# Patient Record
Sex: Male | Born: 1965
Health system: Southern US, Community
[De-identification: ages and names within clinical notes are randomized; demographics above are authoritative.]

## PROBLEM LIST (undated history)

## (undated) DIAGNOSIS — G709 Myoneural disorder, unspecified: Secondary | ICD-10-CM

## (undated) DIAGNOSIS — M1990 Unspecified osteoarthritis, unspecified site: Secondary | ICD-10-CM

## (undated) HISTORY — DX: Myoneural disorder, unspecified: G70.9

## (undated) HISTORY — PX: WISDOM TOOTH EXTRACTION: SHX21

## (undated) HISTORY — DX: Unspecified osteoarthritis, unspecified site: M19.90

## (undated) HISTORY — PX: COLONOSCOPY: SHX5424

---

## 2000-05-19 ENCOUNTER — Emergency Department (HOSPITAL_COMMUNITY): Admission: EM | Admit: 2000-05-19 | Discharge: 2000-05-19 | Payer: Self-pay

## 2000-05-19 ENCOUNTER — Encounter: Payer: Self-pay | Admitting: Emergency Medicine

## 2003-11-09 ENCOUNTER — Encounter: Admission: RE | Admit: 2003-11-09 | Discharge: 2003-11-09 | Payer: Self-pay | Admitting: Family Medicine

## 2017-08-01 ENCOUNTER — Other Ambulatory Visit: Payer: Self-pay

## 2017-08-17 ENCOUNTER — Ambulatory Visit: Payer: BLUE CROSS/BLUE SHIELD | Admitting: Sports Medicine

## 2017-08-17 ENCOUNTER — Encounter: Payer: Self-pay | Admitting: Sports Medicine

## 2017-08-17 ENCOUNTER — Ambulatory Visit: Payer: Self-pay

## 2017-08-17 VITALS — BP 112/80 | HR 52 | Ht 72.0 in | Wt 192.0 lb

## 2017-08-17 DIAGNOSIS — M25511 Pain in right shoulder: Secondary | ICD-10-CM

## 2017-08-17 NOTE — Patient Instructions (Signed)

## 2017-08-17 NOTE — Assessment & Plan Note (Signed)
Long-standing shoulder pain with multifactorial etiology but concern for potential SLAP tear in the setting of underlying supraspinatus and subscap tendinopathy with possible articular sided tearing of the supraspinatus that is partial-thickness.  There does appear to be some swelling around the posterior labrum and we went ahead and performed an intra-articular injection today.  Hold nitroglycerin times 2 weeks and at that time okay to resume activities as tolerated.  Continue with therapeutic exercises after 48-72 hours and slowly increase them as tolerated but being cautious for the first 2 weeks.  Avoid overhead lifting and pushing activities.  If any lack of improvement MR arthrogram recommended.

## 2017-08-17 NOTE — Procedures (Signed)
PROCEDURE NOTE:  Ultrasound Guided: Injection: right Shoulder Intra-articular Images were obtained and interpreted by myself, Gaspar BiddingMichael Maisa Bedingfield, DO  Images have been saved and stored to PACS system. Images obtained on: GE S7 Ultrasound machine  ULTRASOUND FINDINGS:  Biceps Tendon: Normal Pec Major Insertion: Normal Subscapularis Tendon: Slight interstitial tearing with diminutive size given the overlying musculature.  Possible small articular sided tear Supraspinatus Tendon: Chronic tendinopathy with interstitial splitting at minimum and possible articular sided partial-thickness tear that is oblique in nature. Infraspinatus/Teres Minor Tendon: Normal AC Joint: Moderate arthrosis with positive mushroom sign, no pain with sono palpation JOINT: Mild glenohumeral spurring but this is minimal. LABRUM: Appears to have a likely intrasubstance tear   DESCRIPTION OF PROCEDURE:  The patient's clinical condition is marked by substantial pain and/or significant functional disability. Other conservative therapy has not provided relief, is contraindicated, or not appropriate. There is a reasonable likelihood that injection will significantly improve the patient's pain and/or functional impairment.  After discussing the risks, benefits and expected outcomes of the injection and all questions were reviewed and answered, the patient wished to undergo the above named procedure. Verbal consent was obtained.  The ultrasound was used to identify the target structure and adjacent neurovascular structures. The skin was then prepped in sterile fashion and the target structure was injected under direct visualization using sterile technique as below:  PREP: Alcohol, Ethel Chloride  APPROACH: posterior, stopcock technique, 22g 3.5in.  INJECTATE: 5cc: 1% lidocaine, 2cc: 0.5% marcaine, 2cc: 40mg /mL DepoMedrol   ASPIRATE: None   DRESSING: Band-Aid  Post procedural instructions including recommending icing and warning  signs for infection were reviewed.   This procedure was well tolerated and there were no complications.   IMPRESSION: Succesful Ultrasound Guided: Injection

## 2017-08-17 NOTE — Progress Notes (Signed)
Veverly FellsMichael D. Delorise Shinerigby, DO  [facility] [phone]  Jeffrey Nicholson - 52 y.o. male MRN [id number]  Date of birth: 1965/08/11  Visit Date: 08/17/2017  PCP: Ozella RocksMerrell, David J, MD   Referred by: Ozella RocksMerrell, David J, MD   Scribe for today's visit: Stevenson ClinchBrandy Coleman, CMA     SUBJECTIVE:  Jeffrey Nicholson is here for New Patient (Initial Visit) (R shoulder pain) .  Referred by: Dr. Shelly Flattenavid Merrell His R shoulder pain symptoms INITIALLY: Began about 2 years ago and MOI is unknown. He was doing cross training a couple of years ago around the time the pain started.  Described as mild aching, nonradiating Worsened with rotation, reaching down and back. Pain is also worse when sleeping on R side.  Improved with rest. He has gotten some relief when using a tennis ball or foam roller.  Additional associated symptoms include: Pain is lateral, feels like its under the deltoid. He does have some pain in his neck, feels like neck needs to be cracked. He denies popping or clicking in the shoulder.     At this time symptoms show no change compared to onset  He has tried taking Meloxicam and following Nitro Protocol with minimal relief. He has tried icing the shoulder with minimal relief. He has tried Tylenol with minimal relief. He has tried going to PT and home exercises with minimal relief.   No recent xray of the shoulder or neck.    ROS Reports night time disturbances. Denies fevers, chills, or night sweats. Denies unexplained weight loss. Denies personal history of cancer. Denies changes in bowel or bladder habits. Denies recent unreported falls. Denies new or worsening dyspnea or wheezing. Denies headaches or dizziness.  Denies numbness, tingling or weakness  In the extremities.  Denies dizziness or presyncopal episodes Denies lower extremity edema    HISTORY & PERTINENT PRIOR DATA:  Prior History reviewed and updated per electronic medical  record.  Significant history, findings, studies and interim changes include:  reports that  has never smoked. he has never used smokeless tobacco. No results for input(s): HGBA1C, LABURIC, CREATINE in the last 8760 hours. No specialty comments available. Problem  Right Shoulder Pain    OBJECTIVE:  VS:  HT:6' (182.9 cm)   WT:192 lb (87.1 kg)  BMI:26.03    BP:112/80  HR:(!) 52bpm  TEMP: ( )  RESP:97 %   PHYSICAL EXAM: Constitutional: WDWN, Non-toxic appearing. Psychiatric: Alert & appropriately interactive.  Not depressed or anxious appearing. Respiratory: No increased work of breathing.  Trachea Midline Eyes: Pupils are equal.  EOM intact without nystagmus.  No scleral icterus  NEUROVASCULAR exam: No clubbing or cyanosis appreciated No significant venous stasis changes Capillary Refill: normal, less than 2 seconds   Right Shoulder Exam: Normal alignment & Contours, bossing of the AC joint Skin: No overlying erythema/ecchymosis Neck: normal range of motion and supple Axial loading and circumduction produces: Small amount of pain  Non tender to palpation over: Bony Landmarks TTP over: none Drop arm test: negative Hawkins: normal, no pain  Neers: normal, no pain   Internal Rotation:  ROM: Normal with mild pain Strength: 5/5 External Rotation:  ROM: Normal with no pain Strength: 5/5  Empty can: positive, mild pain Strength: 5/5 Speeds: positive, mild pain Strength: 5/5 O'Briens: positive, moderate pain Strength: 5/5  ASSESSMENT & PLAN:   1. Right shoulder pain, unspecified chronicity    ++++++++++++++++++++++++++++++++++++++++++++ Orders & Meds:  Orders Placed This Encounter  Procedures  .  [name] MSK POCT ULTRASOUND   No orders of the defined types were placed in this encounter.   ++++++++++++++++++++++++++++++++++++++++++++ PLAN:   Findings:  See below.  Additionally does have he does have AC joint arthropathy at the very least and likely a type II acromion at  minimum and I suspect he is getting some subacromial impingement from the arthritic change.  If lack of improvement plain film x-rays and MR arthrogram will be recommended for potential surgical intervention for subacromial decompression and possible labral repair if indicated.   Right shoulder pain Long-standing shoulder pain with multifactorial etiology but concern for potential SLAP tear in the setting of underlying supraspinatus and subscap tendinopathy with possible articular sided tearing of the supraspinatus that is partial-thickness.  There does appear to be some swelling around the posterior labrum and we went ahead and performed an intra-articular injection today.  Hold nitroglycerin times 2 weeks and at that time okay to resume activities as tolerated.  Continue with therapeutic exercises after 48-72 hours and slowly increase them as tolerated but being cautious for the first 2 weeks.  Avoid overhead lifting and pushing activities.  If any lack of improvement MR arthrogram recommended.   Follow-up: Return in about 6 weeks (around 09/28/2017).   Pertinent documentation may be included in additional procedure notes, imaging studies, problem based documentation and patient instructions. Please see these sections of the encounter for additional information regarding this visit. CMA/ATC served as Neurosurgeon during this visit. History, Physical, and Plan performed by medical provider. Documentation and orders reviewed and attested to.      Andrena Mews, DO    [facility] Physician

## 2017-09-28 ENCOUNTER — Ambulatory Visit: Payer: BLUE CROSS/BLUE SHIELD | Admitting: Sports Medicine

## 2017-09-28 ENCOUNTER — Encounter: Payer: Self-pay | Admitting: Sports Medicine

## 2017-09-28 VITALS — BP 130/86 | HR 65 | Ht 72.0 in | Wt 192.2 lb

## 2017-09-28 DIAGNOSIS — M25511 Pain in right shoulder: Secondary | ICD-10-CM | POA: Diagnosis not present

## 2017-09-28 NOTE — Progress Notes (Signed)
Veverly Fells. [facility] [phone]  Jeffrey Nicholson - 52 y.o. male MRN [id number]  Date of birth: 1966/04/30  Visit Date: 09/28/2017  PCP: Ozella Rocks, MD   Referred by: Ozella Rocks, MD  Scribe for today's visit: Christoper Fabian, LAT, ATC     SUBJECTIVE:  Jeffrey Nicholson is here for Follow-up (R shoulder pain) .   Notes from Initial Visit on 08/17/17 His R shoulder pain symptoms INITIALLY: Began about 2 years ago and MOI is unknown. He was doing cross training a couple of years ago around the time the pain started.  Described as mild aching, nonradiating Worsened with rotation, reaching down and back. Pain is also worse when sleeping on R side.  Improved with rest. He has gotten some relief when using a tennis ball or foam roller.  Additional associated symptoms include: Pain is lateral, feels like its under the deltoid. He does have some pain in his neck, feels like neck needs to be cracked. He denies popping or clicking in the shoulder.     At this time symptoms show no change compared to onset  He has tried taking Meloxicam and following Nitro Protocol with minimal relief. He has tried icing the shoulder with minimal relief. He has tried Tylenol with minimal relief. He has tried going to PT and home exercises with minimal relief.   No recent xray of the shoulder or neck.   09/28/17: Compared to the last office visit on 08/17/17, his previously described R shoulder pain symptoms are improving noting about 60-70% improvement.  He states that he has not been working out at the gym over the past 6 weeks so he's curious to see how his shoulder feels when he resumes this. Current symptoms are mild & are nonradiating He has used both nitroglycerin patches and meloxicam previously.  Had a R shoulder injection at his last visit.  ROS Denies night time disturbances. Denies fevers, chills, or night  sweats. Denies unexplained weight loss. Denies personal history of cancer. Denies changes in bowel or bladder habits. Denies recent unreported falls. Denies new or worsening dyspnea or wheezing. Denies headaches or dizziness.  Denies numbness, tingling or weakness  In the extremities.  Denies dizziness or presyncopal episodes Denies lower extremity edema    HISTORY & PERTINENT PRIOR DATA:  Prior History reviewed and updated per electronic medical record.  Significant/pertinent history, findings, studies include:  reports that he has never smoked. He has never used smokeless tobacco. No results for input(s): HGBA1C, LABURIC, CREATINE in the last 8760 hours. No specialty comments available. No problems updated.  OBJECTIVE:  VS:  HT:6' (182.9 cm)   WT:192 lb 3.2 oz (87.2 kg)  BMI:26.06    BP:130/86  HR:65bpm  TEMP: ( )  RESP:96 %   PHYSICAL EXAM: Constitutional: WDWN, Non-toxic appearing. Psychiatric: Alert & appropriately interactive.  Not depressed or anxious appearing. Respiratory: No increased work of breathing.  Trachea Midline Eyes: Pupils are equal.  EOM intact without nystagmus.  No scleral icterus  Vascular Exam: warm to touch no edema  upper extremity neuro exam: unremarkable  MSK Exam: Right shoulder is overall well aligned.  Still has a slight scapular protraction with overhead reaching and with slight scapular dyskinesis but markedly improved.  His intrinsic rotator cuff strength is 5 out of 5.  Mildly positive Hawkins.   ASSESSMENT & PLAN:   1. Right shoulder pain, unspecified chronicity     PLAN:  Overall is doing better.  I would like for him to slowly return to activities as tolerated try to limit the overhead activities at the gym.  We did discuss that overloading his AC joint as well as the glenohumeral joint will tend to exacerbate his symptoms and also emphasized the importance of appropriate scapular setting/stabilization.  He will follow-up as  needed if any return of his symptoms but overall is happy with his progress.  Follow-up: No follow-ups on file.      Please see additional documentation for Objective, Assessment and Plan sections. Pertinent additional documentation may be included in corresponding procedure notes, imaging studies, problem based documentation and patient instructions. Please see these sections of the encounter for additional information regarding this visit.  CMA/ATC served as Neurosurgeonscribe during this visit. History, Physical, and Plan performed by medical provider. Documentation and orders reviewed and attested to.      Andrena MewsMichael D Joellen Tullos, DO    [facility] Physician

## 2017-10-24 ENCOUNTER — Encounter: Payer: Self-pay | Admitting: Gastroenterology

## 2017-10-26 ENCOUNTER — Encounter: Payer: Self-pay | Admitting: Sports Medicine

## 2017-11-16 ENCOUNTER — Ambulatory Visit: Payer: Self-pay

## 2017-11-16 ENCOUNTER — Ambulatory Visit: Payer: BLUE CROSS/BLUE SHIELD | Admitting: Sports Medicine

## 2017-11-16 ENCOUNTER — Ambulatory Visit (INDEPENDENT_AMBULATORY_CARE_PROVIDER_SITE_OTHER): Payer: BLUE CROSS/BLUE SHIELD

## 2017-11-16 ENCOUNTER — Encounter: Payer: Self-pay | Admitting: Sports Medicine

## 2017-11-16 VITALS — BP 120/82 | HR 65 | Ht 72.0 in | Wt 193.4 lb

## 2017-11-16 DIAGNOSIS — M25562 Pain in left knee: Secondary | ICD-10-CM | POA: Diagnosis not present

## 2017-11-16 DIAGNOSIS — M94262 Chondromalacia, left knee: Secondary | ICD-10-CM | POA: Diagnosis not present

## 2017-11-16 MED ORDER — DICLOFENAC SODIUM 2 % TD SOLN: 1.0000 "application " | Freq: Two times a day (BID) | TRANSDERMAL | 2 refills | Status: DC

## 2017-11-16 MED ORDER — DICLOFENAC SODIUM 2 % TD SOLN: 1.0000 "application " | Freq: Two times a day (BID) | TRANSDERMAL | 0 refills | Status: AC

## 2017-11-16 MED ORDER — MELOXICAM 15 MG PO TABS: ORAL_TABLET | ORAL | 1 refills | Status: DC

## 2017-11-16 NOTE — Progress Notes (Signed)
  Jeffrey Nicholson. [facility] [phone]  Jeffrey Nicholson - 52 y.o. male MRN [id number]  Date of birth: 06-22-1965  Visit Date: 11/16/2017  PCP: Ozella Rocks, MD   Referred by: Ozella Rocks, MD  Scribe for today's visit: Stevenson Clinch, CMA     SUBJECTIVE:  Jeffrey Nicholson is here for Follow-up (L knee pain)  His L knee pain symptoms INITIALLY: Began a couple of weeks ago and MOI is unknown. No past injuries to the knee.  Described as mild aching, nonradiating Worsened with squatting, leg press, stairs Improved with rest Additional associated symptoms include: He denies swelling around the knee. He has heard crackling noises but denies popping.     At this time symptoms are improving compared to onset. He has been taking IBU with minimal relief.   He reports improvement with shoulder pain, still has some stiffness but overall doing better.   ROS Denies night time disturbances. Denies fevers, chills, or night sweats. Denies unexplained weight loss. Denies personal history of cancer. Denies changes in bowel or bladder habits. Denies recent unreported falls. Denies new or worsening dyspnea or wheezing. Denies headaches or dizziness.  Denies numbness, tingling or weakness  In the extremities.  Denies dizziness or presyncopal episodes Denies lower extremity edema    HISTORY & PERTINENT PRIOR DATA:  Prior History reviewed and updated per electronic medical record.  Significant/pertinent history, findings, studies include:  reports that he has never smoked. He has never used smokeless tobacco. No results for input(s): HGBA1C, LABURIC, CREATINE in the last 8760 hours. No specialty comments available. No problems updated.  OBJECTIVE:  VS:  HT:6' (182.9 cm)   WT:193 lb 6.4 oz (87.7 kg)  BMI:26.22    BP:120/82  HR:65bpm  TEMP: ( )  RESP:96 %   PHYSICAL EXAM: Constitutional: WDWN, Non-toxic  appearing. Psychiatric: Alert & appropriately interactive.  Not depressed or anxious appearing. Respiratory: No increased work of breathing.  Trachea Midline Eyes: Pupils are equal.  EOM intact without nystagmus.  No scleral icterus  Vascular Exam: warm to touch no edema  lower extremity neuro exam: unremarkable  MSK Exam: Left knee overall well aligned without significant deformity.  He has mild pain with patellar compression test but this is minimal.  Small amount of crepitation appreciated.  He is ligamentously stable.  Poor VMO definition.  Hip abduction strength is diminished bilaterally but worse on the left.   ASSESSMENT & PLAN:   1. Acute pain of left knee   2. Chondromalacia of left knee     PLAN: Discussed the foundation of treatment for this condition is physical therapy and/or daily (5-6 days/week) therapeutic exercises, focusing on core strengthening, coordination, neuromuscular control/reeducation.  Therapeutic exercises prescribed per procedure note.  Topical anti-inflammatories as well as systemic Mobic for short duration.  Follow-up: Return if symptoms worsen or fail to improve.      Please see additional documentation for Objective, Assessment and Plan sections. Pertinent additional documentation may be included in corresponding procedure notes, imaging studies, problem based documentation and patient instructions. Please see these sections of the encounter for additional information regarding this visit.  CMA/ATC served as Neurosurgeon during this visit. History, Physical, and Plan performed by medical provider. Documentation and orders reviewed and attested to.      Andrena Mews, DO    [facility] Physician

## 2017-11-16 NOTE — Patient Instructions (Addendum)
Please perform the exercise program that we have prepared for you and gone over in detail on a daily basis.  In addition to the handout you were provided you can access your program through: [email or web address]   Your unique program code is: Scientist, research (medical)[email or web address]   [facility] instructions for Duexis, Pennsaid and Vimovo:  Your prescription will be filled through a mail order pharmacy.  It is typically [facility] but may vary depending on where you live.  You will receive a phone call from them which will typically come from a [phone] phone number.  You must speak directly to them to have this medication filled.  When the pharmacy calls, they will need your mailing address (for overnight shipment of the medication) andy they will need payment information if you have a copay (typically no more than $10). If you have not heard from them 2-3 days after your appointment with Dr. Berline Choughigby, contact us at the office [phone]) or through MyChart so we can reach back out to the pharmacy.

## 2017-12-08 ENCOUNTER — Encounter: Payer: Self-pay | Admitting: Sports Medicine

## 2017-12-10 ENCOUNTER — Telehealth: Payer: Self-pay | Admitting: Family Medicine

## 2017-12-10 NOTE — Telephone Encounter (Signed)
See note

## 2017-12-10 NOTE — Telephone Encounter (Signed)
Copied from CRM [phone]. Topic: Quick Communication - Rx Refill/Question >> Dec 10, 2017  2:59 PM Cipriano BunkerLambe, Annette S wrote: Medication: Diclofenac Sodium (PENNSAID) 2 % SOLN  Needing diagnoses and past medications tried  Has the patient contacted their pharmacy? Yes.   (Agent: If no, request that the patient contact the pharmacy for the refill.) (Agent: If yes, when and what did the pharmacy advise?)  Preferred Pharmacy (with phone number or street name):   Crystal from [facility]  [phone]  Fax [phone] From  [facility] [place] - [address] [place] [id number] Phone: [phone] Fax: [phone]    Agent: Please be advised that RX refills may take up to 3 business days. We ask that you follow-up with your pharmacy.

## 2017-12-11 NOTE — Telephone Encounter (Signed)
Called and spoke with Crystal, provided dx code [id number] and medications IBU and Meloxicam. No further actions needed at this time.

## 2018-01-02 ENCOUNTER — Ambulatory Visit (AMBULATORY_SURGERY_CENTER): Payer: Self-pay | Admitting: *Deleted

## 2018-01-02 ENCOUNTER — Other Ambulatory Visit: Payer: Self-pay

## 2018-01-02 VITALS — Ht 72.0 in | Wt 197.0 lb

## 2018-01-02 DIAGNOSIS — Z1211 Encounter for screening for malignant neoplasm of colon: Secondary | ICD-10-CM

## 2018-01-02 MED ORDER — NA SULFATE-K SULFATE-MG SULF 17.5-3.13-1.6 GM/177ML PO SOLN: 1.0000 | Freq: Once | ORAL | 0 refills | Status: AC

## 2018-01-02 NOTE — Progress Notes (Signed)
No egg or soy allergy known to patient  No issues with past sedation with any surgeries  or procedures, no intubation problems  No diet pills per patient No home 02 use per patient  No blood thinners per patient  Pt denies issues with constipation  No A fib or A flutter  EMMI video sent to pt's e mail  Online $15 suprep coupon to pt in [place] today

## 2018-01-03 ENCOUNTER — Encounter: Payer: Self-pay | Admitting: Gastroenterology

## 2018-01-13 ENCOUNTER — Other Ambulatory Visit: Payer: Self-pay | Admitting: Sports Medicine

## 2018-01-16 ENCOUNTER — Encounter: Payer: BLUE CROSS/BLUE SHIELD | Admitting: Gastroenterology

## 2018-02-05 ENCOUNTER — Encounter: Payer: BLUE CROSS/BLUE SHIELD | Admitting: Gastroenterology

## 2018-02-07 ENCOUNTER — Other Ambulatory Visit: Payer: Self-pay | Admitting: Sports Medicine

## 2018-03-11 ENCOUNTER — Ambulatory Visit (AMBULATORY_SURGERY_CENTER): Payer: BLUE CROSS/BLUE SHIELD | Admitting: Gastroenterology

## 2018-03-11 ENCOUNTER — Encounter: Payer: Self-pay | Admitting: Gastroenterology

## 2018-03-11 VITALS — BP 107/67 | HR 60 | Temp 99.6°F | Resp 12 | Ht 72.0 in | Wt 197.0 lb

## 2018-03-11 DIAGNOSIS — Z1211 Encounter for screening for malignant neoplasm of colon: Secondary | ICD-10-CM | POA: Diagnosis present

## 2018-03-11 MED ORDER — SODIUM CHLORIDE 0.9 % IV SOLN: 500.0000 mL | Freq: Once | INTRAVENOUS | Status: DC

## 2018-03-11 NOTE — Op Note (Signed)
[facility] Patient Name: Jeffrey Nicholson Procedure Date: 03/11/2018 1:38 PM MRN: [id number] Endoscopist: Sherilyn CooterHenry L. Myrtie Neitheranis , MD Age: 52 Referring MD:  Date of Birth: 1966/05/24 Gender: Male Account #: [id number] Procedure:                Colonoscopy Indications:              Screening for colorectal malignant neoplasm, This                            is the patient's first colonoscopy Medicines:                Monitored Anesthesia Care Procedure:                Pre-Anesthesia Assessment:                           - Prior to the procedure, a History and Physical                            was performed, and patient medications and                            allergies were reviewed. The patient's tolerance of                            previous anesthesia was also reviewed. The risks                            and benefits of the procedure and the sedation                            options and risks were discussed with the patient.                            All questions were answered, and informed consent                            was obtained. Prior Anticoagulants: The patient has                            taken no previous anticoagulant or antiplatelet                            agents. ASA Grade Assessment: II - A patient with                            mild systemic disease. After reviewing the risks                            and benefits, the patient was deemed in                            satisfactory condition to undergo the procedure.  After obtaining informed consent, the colonoscope                            was passed under direct vision. Throughout the                            procedure, the patient's blood pressure, pulse, and                            oxygen saturations were monitored continuously. The                            Colonoscope was introduced through the anus and                            advanced to the the  cecum, identified by                            appendiceal orifice and ileocecal valve. The                            colonoscopy was performed without difficulty. The                            patient tolerated the procedure well. The quality                            of the bowel preparation was excellent. The                            ileocecal valve, appendiceal orifice, and rectum                            were photographed. The quality of the bowel                            preparation was evaluated using the BBPS [facility]                            Bowel Preparation Scale) with scores of: Right                            Colon = 3, Transverse Colon = 3 and Left Colon = 3                            (entire mucosa seen well with no residual staining,                            small fragments of stool or opaque liquid). The                            total BBPS score equals 9. Scope In: 1:40:40 PM Scope Out: 1:52:22 PM Scope Withdrawal Time: 0 hours 8  minutes 1 second  Total Procedure Duration: 0 hours 11 minutes 42 seconds  Findings:                 The perianal and digital rectal examinations were                            normal.                           The entire examined colon appeared normal on direct                            and retroflexion views. Complications:            No immediate complications. Estimated Blood Loss:     Estimated blood loss: none. Impression:               - The entire examined colon is normal on direct and                            retroflexion views.                           - No specimens collected. Recommendation:           - Patient has a contact number available for                            emergencies. The signs and symptoms of potential                            delayed complications were discussed with the                            patient. Return to normal activities tomorrow.                            Written discharge  instructions were provided to the                            patient.                           - Resume previous diet.                           - Continue present medications.                           - Repeat colonoscopy in 10 years for screening                            purposes. Jennette Leask L. Myrtie Neither, MD 03/11/2018 1:54:05 PM This report has been signed electronically.

## 2018-03-11 NOTE — Patient Instructions (Signed)

## 2018-03-11 NOTE — Progress Notes (Signed)
A and O x3. Report to RN. Tolerated MAC anesthesia well.

## 2018-03-11 NOTE — Progress Notes (Signed)
Pt's states no medical or surgical changes since previsit or office visit. 

## 2018-03-12 ENCOUNTER — Telehealth: Payer: Self-pay | Admitting: *Deleted

## 2018-03-12 ENCOUNTER — Telehealth: Payer: Self-pay

## 2018-03-12 NOTE — Telephone Encounter (Signed)
Left message on answering machine. 

## 2018-03-12 NOTE — Telephone Encounter (Signed)
Message left

## 2019-01-26 IMAGING — DX DG KNEE AP/LAT W/ SUNRISE*L*
3 series · 3 of 3 positions shown · non-contrast
Comparison: None.

CLINICAL DATA: Patient complains of left anterior knee pain x 4
weeks, noticed onset around the same time he returned to the gym.
Patient denies injury. No history of left knee injury or surgery.

EXAM:
LEFT KNEE 3 VIEWS

[knee standing ap]
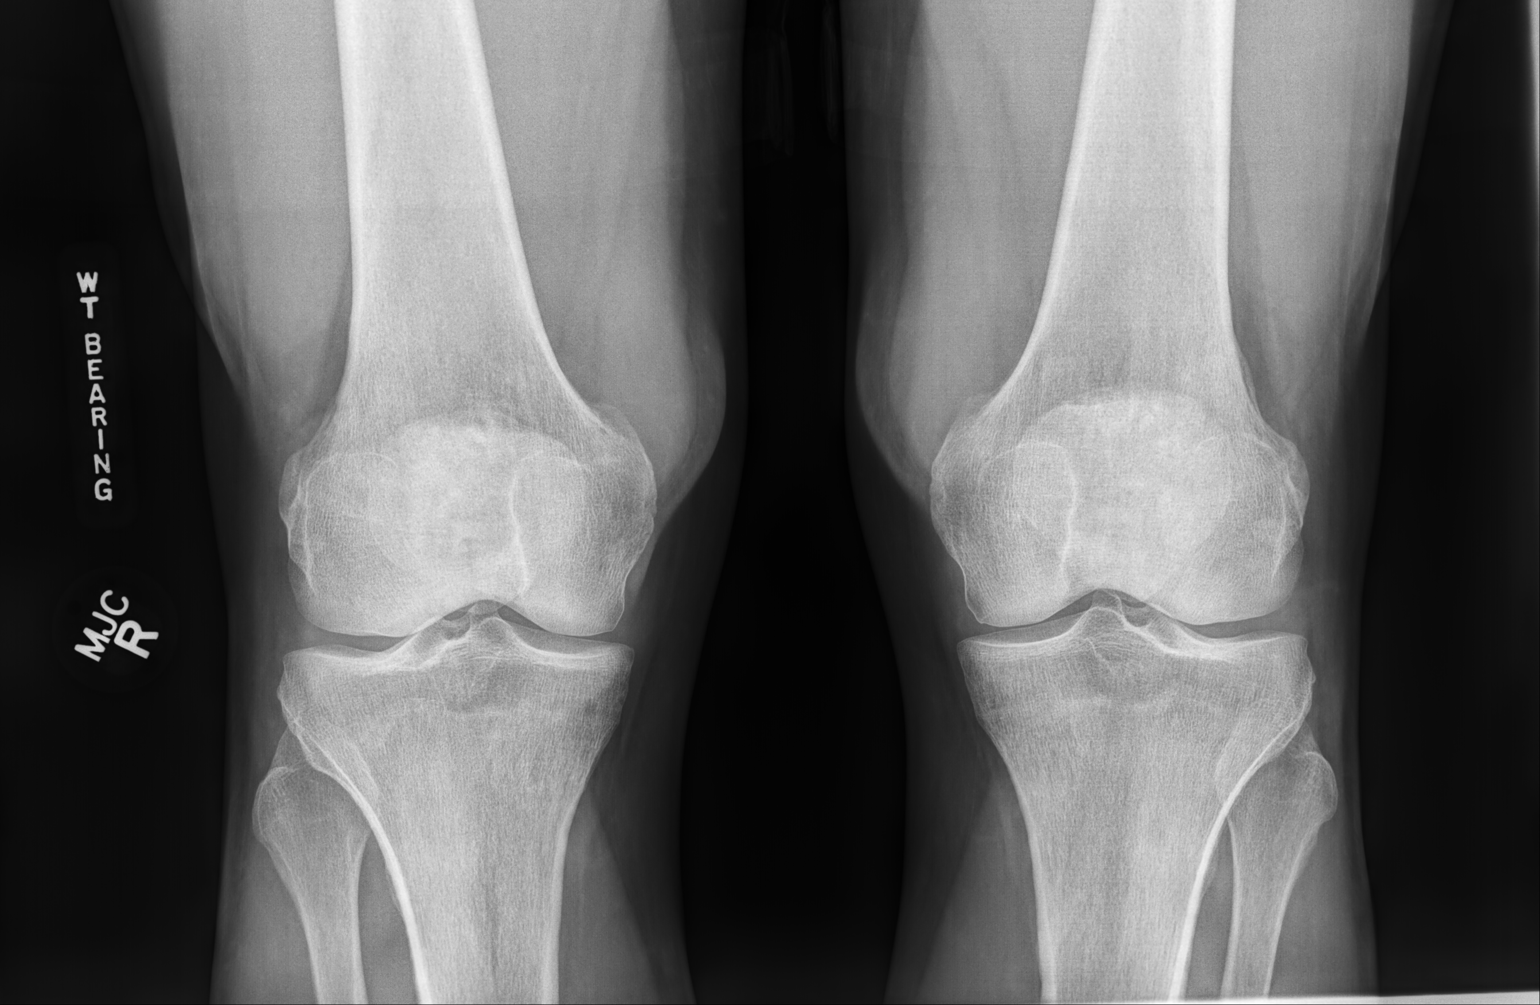

[knee standing lat]
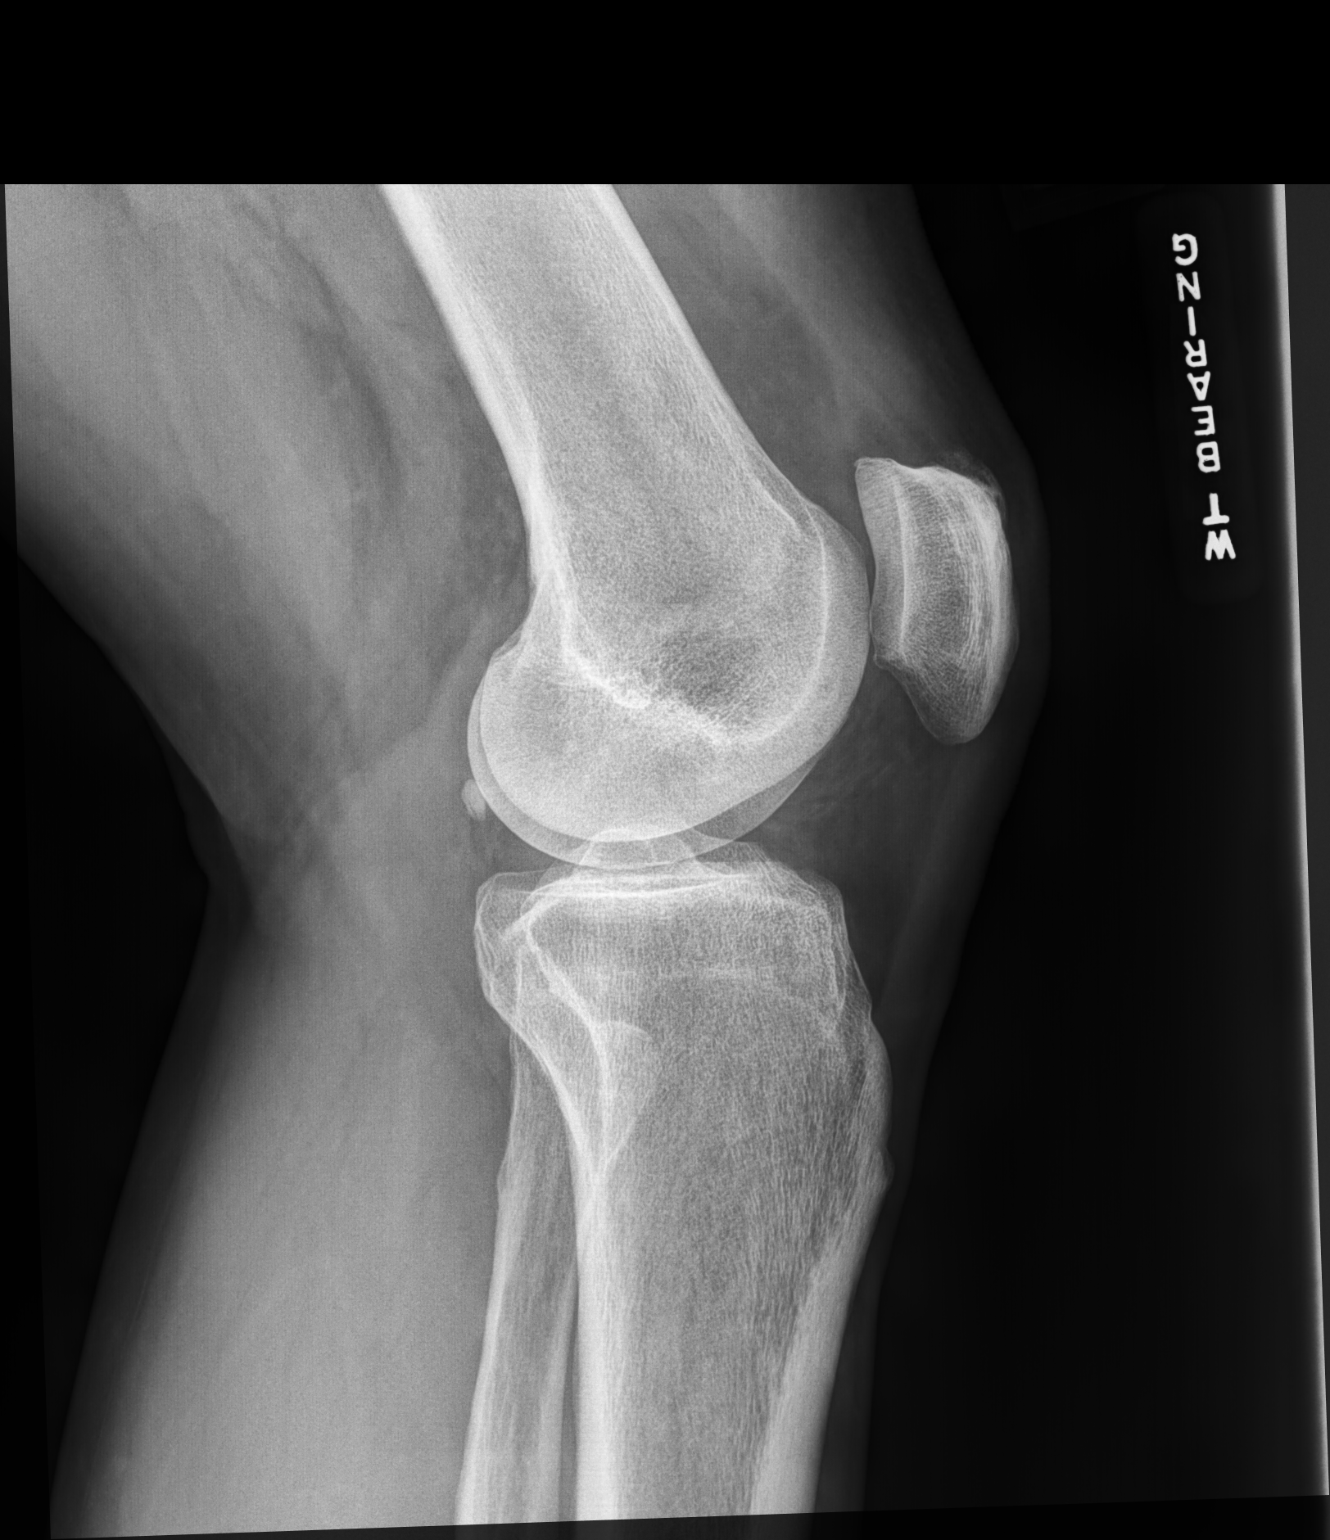

[sunrise]
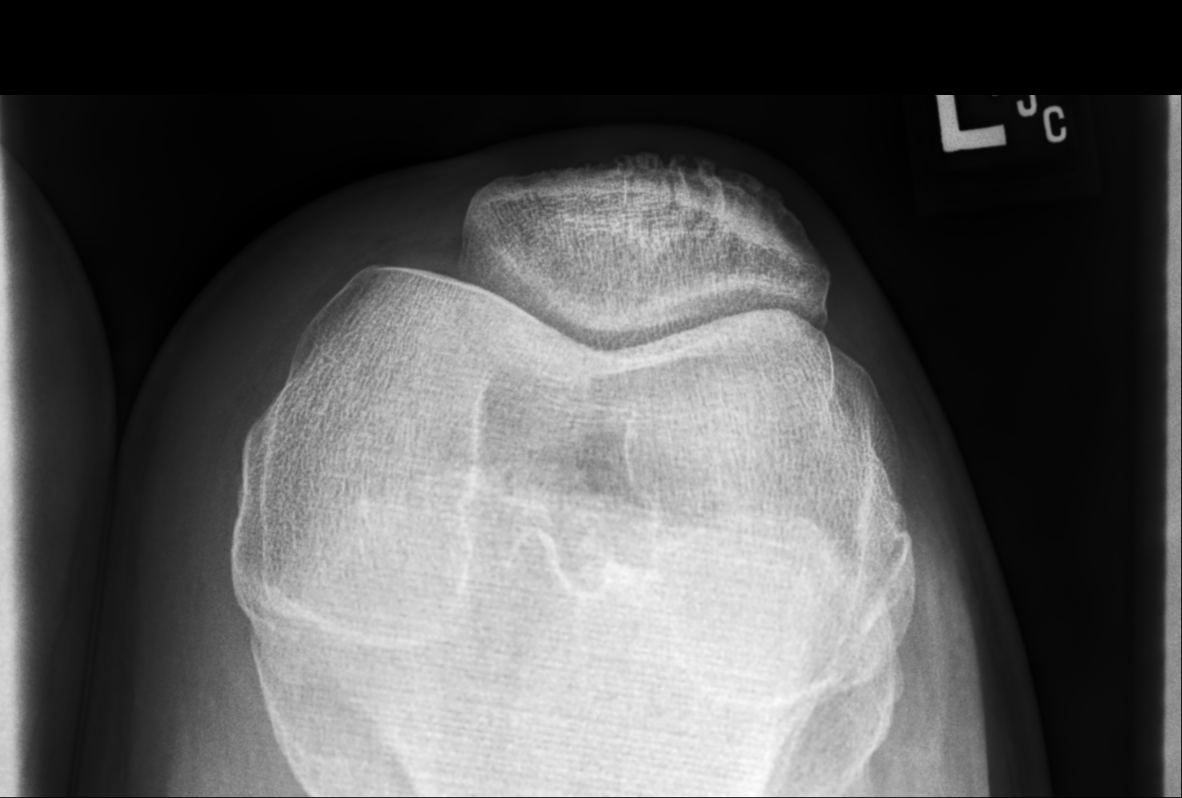

[3 of 3 positions shown; findings below may reference images not displayed]

FINDINGS: No fracture.  No bone lesion.

Knee joint normally spaced and aligned. No significant arthropathic
change.

No joint effusion.

Surrounding soft tissues are unremarkable.
IMPRESSION: Negative.

## 2020-04-02 ENCOUNTER — Ambulatory Visit (INDEPENDENT_AMBULATORY_CARE_PROVIDER_SITE_OTHER): Payer: BC Managed Care – PPO | Admitting: Family Medicine

## 2020-04-02 ENCOUNTER — Other Ambulatory Visit: Payer: Self-pay

## 2020-04-02 ENCOUNTER — Encounter: Payer: Self-pay | Admitting: Family Medicine

## 2020-04-02 VITALS — BP 115/73 | HR 50 | Temp 98.4°F | Ht 72.0 in | Wt 191.2 lb

## 2020-04-02 DIAGNOSIS — G4700 Insomnia, unspecified: Secondary | ICD-10-CM | POA: Diagnosis not present

## 2020-04-02 DIAGNOSIS — Z0001 Encounter for general adult medical examination with abnormal findings: Secondary | ICD-10-CM

## 2020-04-02 DIAGNOSIS — Z13220 Encounter for screening for lipoid disorders: Secondary | ICD-10-CM

## 2020-04-02 DIAGNOSIS — G2581 Restless legs syndrome: Secondary | ICD-10-CM

## 2020-04-02 DIAGNOSIS — M1990 Unspecified osteoarthritis, unspecified site: Secondary | ICD-10-CM

## 2020-04-02 DIAGNOSIS — Z125 Encounter for screening for malignant neoplasm of prostate: Secondary | ICD-10-CM

## 2020-04-02 DIAGNOSIS — Z23 Encounter for immunization: Secondary | ICD-10-CM | POA: Diagnosis not present

## 2020-04-02 DIAGNOSIS — Z131 Encounter for screening for diabetes mellitus: Secondary | ICD-10-CM

## 2020-04-02 MED ORDER — ZOLPIDEM TARTRATE 10 MG PO TABS: 10.0000 mg | ORAL_TABLET | Freq: Once | ORAL | 1 refills | Status: DC

## 2020-04-02 NOTE — Assessment & Plan Note (Signed)
Does not want to start medications.  Discussed behavioral modifications.  Will check ferritin, CBC, CMET, TSH.

## 2020-04-02 NOTE — Progress Notes (Signed)
Chief Complaint:  Jeffrey Nicholson is a 54 y.o. male who presents today for his annual comprehensive physical exam.    Assessment/Plan:  Chronic Problems Addressed Today: Restless legs Does not want to start medications.  Discussed behavioral modifications.  Will check ferritin, CBC, CMET, TSH.  Osteoarthritis Stable.  Continue OTC meds as needed.  Insomnia Stable. Ambien refilled. Database with no redflags.    Body mass index is 25.93 kg/m. / Overweight  BMI Metric Follow Up - 04/02/20 1044      BMI Metric Follow Up-Please document annually   BMI Metric Follow Up Education provided           Preventative Healthcare: Tdap given today. Will get flu vaccine at work.  Recommended Covid booster and shingles vaccine.  Will check CBC, CMET, TSH.  Check PSA. UTD on colon cancer screening.   Patient Counseling(The following topics were reviewed and/or handout was given):  -Nutrition: Stressed importance of moderation in sodium/caffeine intake, saturated fat and cholesterol, caloric balance, sufficient intake of fresh fruits, vegetables, and fiber.  -Stressed the importance of regular exercise.   -Substance Abuse: Discussed cessation/primary prevention of tobacco, alcohol, or other drug use; driving or other dangerous activities under the influence; availability of treatment for abuse.   -Injury prevention: Discussed safety belts, safety helmets, smoke detector, smoking near bedding or upholstery.   -Sexuality: Discussed sexually transmitted diseases, partner selection, use of condoms, avoidance of unintended pregnancy and contraceptive alternatives.   -Dental health: Discussed importance of regular tooth brushing, flossing, and dental visits.  -Health maintenance and immunizations reviewed. Please refer to Health maintenance section.  Return to care in 1 year for next preventative visit.     Subjective:  HPI:  He has no acute complaints today.  See A/p for status of chronic  conditions.   Lifestyle Diet: Balanced.  Exercise: Lifting 2-3 times per week. Also works on cardio.   Depression screen PHQ 2/9 04/02/2020  Decreased Interest 0  Down, Depressed, Hopeless 0  PHQ - 2 Score 0  Altered sleeping 1  Tired, decreased energy 0  Change in appetite 0  Feeling bad or failure about yourself  0  Trouble concentrating 0  Moving slowly or fidgety/restless 0  Suicidal thoughts 0  PHQ-9 Score 1  Difficult doing work/chores Not difficult at all    Health Maintenance Due  Topic Date Due  . Hepatitis C Screening  Never done  . COVID-19 Vaccine (1) Never done  . HIV Screening  Never done     ROS: Per HPI, otherwise a complete review of systems was negative.   PMH:  The following were reviewed and entered/updated in epic: Past Medical History:  Diagnosis Date  . Arthritis    knee  . Neuromuscular disorder (HCC)    RLS    Patient Active Problem List   Diagnosis Date Noted  . Insomnia 04/02/2020  . Osteoarthritis 04/02/2020  . Restless legs 04/02/2020   Past Surgical History:  Procedure Laterality Date  . WISDOM TOOTH EXTRACTION     age 29    Family History  Problem Relation Age of Onset  . Colon cancer Neg Hx   . Colon polyps Neg Hx   . Esophageal cancer Neg Hx   . Rectal cancer Neg Hx   . Stomach cancer Neg Hx     Medications- reviewed and updated Current Outpatient Medications  Medication Sig Dispense Refill  . Diclofenac Sodium (PENNSAID) 2 % SOLN Place 1 application onto the skin 2 (two)  times daily. 112 g 2  . zolpidem (AMBIEN) 10 MG tablet Take 1 tablet (10 mg total) by mouth once for 1 dose. 15 tablet 1   Current Facility-Administered Medications  Medication Dose Route Frequency Provider Last Rate Last Admin  . 0.9 %  sodium chloride infusion  500 mL Intravenous Once Sherrilyn Rist, MD        Allergies-reviewed and updated No Known Allergies  Social History   Socioeconomic History  . Marital status: Married     Spouse name: Not on file  . Number of children: Not on file  . Years of education: Not on file  . Highest education level: Not on file  Occupational History  . Not on file  Tobacco Use  . Smoking status: Former Games developer  . Smokeless tobacco: Former Neurosurgeon    Types: Chew  Substance and Sexual Activity  . Alcohol use: Yes    Comment: 1-2 a week   . Drug use: Never  . Sexual activity: Not on file  Other Topics Concern  . Not on file  Social History Narrative  . Not on file   Social Determinants of Health   Financial Resource Strain:   . Difficulty of Paying Living Expenses: Not on file  Food Insecurity:   . Worried About Programme researcher, broadcasting/film/video in the Last Year: Not on file  . Ran Out of Food in the Last Year: Not on file  Transportation Needs:   . Lack of Transportation (Medical): Not on file  . Lack of Transportation (Non-Medical): Not on file  Physical Activity:   . Days of Exercise per Week: Not on file  . Minutes of Exercise per Session: Not on file  Stress:   . Feeling of Stress : Not on file  Social Connections:   . Frequency of Communication with Friends and Family: Not on file  . Frequency of Social Gatherings with Friends and Family: Not on file  . Attends Religious Services: Not on file  . Active Member of Clubs or Organizations: Not on file  . Attends Banker Meetings: Not on file  . Marital Status: Not on file        Objective:  Physical Exam: BP 115/73   Pulse (!) 50   Temp 98.4 F (36.9 C) (Temporal)   Ht 6' (1.829 m)   Wt 191 lb 3.2 oz (86.7 kg)   SpO2 99%   BMI 25.93 kg/m   Body mass index is 25.93 kg/m. Wt Readings from Last 3 Encounters:  04/02/20 191 lb 3.2 oz (86.7 kg)  03/11/18 197 lb (89.4 kg)  01/02/18 197 lb (89.4 kg)   Gen: NAD, resting comfortably HEENT: TMs normal bilaterally. OP clear. No thyromegaly noted.  CV: RRR with no murmurs appreciated Pulm: NWOB, CTAB with no crackles, wheezes, or rhonchi GI: Normal bowel  sounds present. Soft, Nontender, Nondistended. MSK: no edema, cyanosis, or clubbing noted Skin: warm, dry Offered lab order CMET on him Neuro: CN2-12 grossly intact. Strength 5/5 in upper and lower extremities. Reflexes symmetric and intact bilaterally.  Psych: Normal affect and thought content     Ramesha Poster M. Jimmey Ralph, MD 04/02/2020 10:44 AM

## 2020-04-02 NOTE — Assessment & Plan Note (Signed)
Stable.  Continue OTC meds as needed. 

## 2020-04-02 NOTE — Assessment & Plan Note (Signed)
Stable. Ambien refilled. Database with no redflags.

## 2020-04-02 NOTE — Patient Instructions (Signed)
It was very nice to see you today!  We will check blood work today.  We will give your Tdap today vaccine as well.  I will see you back in year.  Please come back to see me sooner if needed.  Take care, Dr Jimmey Ralph  Please try these tips to maintain a healthy lifestyle:   Eat at least 3 REAL meals and 1-2 snacks per day.  Aim for no more than 5 hours between eating.  If you eat breakfast, please do so within one hour of getting up.    Each meal should contain half fruits/vegetables, one quarter protein, and one quarter carbs (no bigger than a computer mouse)   Cut down on sweet beverages. This includes juice, soda, and sweet tea.     Drink at least 1 glass of water with each meal and aim for at least 8 glasses per day   Exercise at least 150 minutes every week.

## 2020-04-03 LAB — CBC
HCT: 45.2 % (ref 38.5–50.0)
Hemoglobin: 15.3 g/dL (ref 13.2–17.1)
MCH: 29.8 pg (ref 27.0–33.0)
MCHC: 33.8 g/dL (ref 32.0–36.0)
MCV: 88.1 fL (ref 80.0–100.0)
MPV: 9.9 fL (ref 7.5–12.5)
Platelets: 253 10*3/uL (ref 140–400)
RBC: 5.13 10*6/uL (ref 4.20–5.80)
RDW: 12.5 % (ref 11.0–15.0)
WBC: 5.8 10*3/uL (ref 3.8–10.8)

## 2020-04-03 LAB — COMPREHENSIVE METABOLIC PANEL
AG Ratio: 2 (calc) (ref 1.0–2.5)
ALT: 23 U/L (ref 9–46)
AST: 19 U/L (ref 10–35)
Albumin: 4.5 g/dL (ref 3.6–5.1)
Alkaline phosphatase (APISO): 52 U/L (ref 35–144)
BUN: 22 mg/dL (ref 7–25)
CO2: 25 mmol/L (ref 20–32)
Calcium: 9.5 mg/dL (ref 8.6–10.3)
Chloride: 105 mmol/L (ref 98–110)
Creat: 0.94 mg/dL (ref 0.70–1.33)
Globulin: 2.3 g/dL (calc) (ref 1.9–3.7)
Glucose, Bld: 87 mg/dL (ref 65–99)
Potassium: 4.2 mmol/L (ref 3.5–5.3)
Sodium: 141 mmol/L (ref 135–146)
Total Bilirubin: 0.8 mg/dL (ref 0.2–1.2)
Total Protein: 6.8 g/dL (ref 6.1–8.1)

## 2020-04-03 LAB — LIPID PANEL
Cholesterol: 207 mg/dL — ABNORMAL HIGH (ref <200)
HDL: 58 mg/dL (ref >=40)
LDL Cholesterol (Calc): 128 mg/dL (calc) — ABNORMAL HIGH
Non-HDL Cholesterol (Calc): 149 mg/dL (calc) — ABNORMAL HIGH (ref <130)
Total CHOL/HDL Ratio: 3.6 (calc) (ref <5.0)
Triglycerides: 100 mg/dL (ref <150)

## 2020-04-03 LAB — PSA: PSA: 0.37 ng/mL (ref <=4.0)

## 2020-04-03 LAB — TSH: TSH: 1.81 mIU/L (ref 0.40–4.50)

## 2020-04-03 LAB — HEMOGLOBIN A1C
Hgb A1c MFr Bld: 5.1 % of total Hgb (ref <5.7)
Mean Plasma Glucose: 100 (calc)
eAG (mmol/L): 5.5 (calc)

## 2020-04-03 LAB — FERRITIN: Ferritin: 90 ng/mL (ref 38–380)

## 2020-04-05 NOTE — Progress Notes (Signed)
Please inform patient of the following:  Cholesterol is borderline but everything else is NORMAL. Do not need to start any medications but he should continue working on diet and exercise and we can recheck in a year or so.  Jeffrey Nicholson. Jimmey Ralph, MD 04/05/2020 8:15 AM

## 2021-04-04 ENCOUNTER — Encounter: Payer: BC Managed Care – PPO | Admitting: Family Medicine

## 2021-04-22 ENCOUNTER — Other Ambulatory Visit: Payer: Self-pay

## 2021-04-22 ENCOUNTER — Ambulatory Visit (INDEPENDENT_AMBULATORY_CARE_PROVIDER_SITE_OTHER): Payer: 59 | Admitting: Family Medicine

## 2021-04-22 ENCOUNTER — Encounter: Payer: Self-pay | Admitting: Family Medicine

## 2021-04-22 VITALS — BP 118/75 | HR 56 | Temp 98.3°F | Ht 72.0 in | Wt 196.4 lb

## 2021-04-22 DIAGNOSIS — Z125 Encounter for screening for malignant neoplasm of prostate: Secondary | ICD-10-CM

## 2021-04-22 DIAGNOSIS — E785 Hyperlipidemia, unspecified: Secondary | ICD-10-CM | POA: Diagnosis not present

## 2021-04-22 DIAGNOSIS — G4700 Insomnia, unspecified: Secondary | ICD-10-CM | POA: Diagnosis not present

## 2021-04-22 DIAGNOSIS — Z0001 Encounter for general adult medical examination with abnormal findings: Secondary | ICD-10-CM | POA: Diagnosis not present

## 2021-04-22 DIAGNOSIS — G2581 Restless legs syndrome: Secondary | ICD-10-CM

## 2021-04-22 DIAGNOSIS — N529 Male erectile dysfunction, unspecified: Secondary | ICD-10-CM | POA: Insufficient documentation

## 2021-04-22 DIAGNOSIS — F439 Reaction to severe stress, unspecified: Secondary | ICD-10-CM | POA: Diagnosis not present

## 2021-04-22 DIAGNOSIS — M1990 Unspecified osteoarthritis, unspecified site: Secondary | ICD-10-CM

## 2021-04-22 LAB — COMPREHENSIVE METABOLIC PANEL
ALT: 26 U/L (ref 0–53)
AST: 21 U/L (ref 0–37)
Albumin: 4.3 g/dL (ref 3.5–5.2)
Alkaline Phosphatase: 52 U/L (ref 39–117)
BUN: 11 mg/dL (ref 6–23)
CO2: 28 mEq/L (ref 19–32)
Calcium: 9 mg/dL (ref 8.4–10.5)
Chloride: 103 mEq/L (ref 96–112)
Creatinine, Ser: 0.92 mg/dL (ref 0.40–1.50)
GFR: 94.95 mL/min (ref >60.00)
Glucose, Bld: 90 mg/dL (ref 70–99)
Potassium: 4.6 mEq/L (ref 3.5–5.1)
Sodium: 137 mEq/L (ref 135–145)
Total Bilirubin: 0.5 mg/dL (ref 0.2–1.2)
Total Protein: 6.5 g/dL (ref 6.0–8.3)

## 2021-04-22 LAB — LIPID PANEL
Cholesterol: 191 mg/dL (ref 0–200)
HDL: 64.1 mg/dL (ref >39.00)
LDL Cholesterol: 117 mg/dL — ABNORMAL HIGH (ref 0–99)
NonHDL: 127.21
Total CHOL/HDL Ratio: 3
Triglycerides: 52 mg/dL (ref 0.0–149.0)
VLDL: 10.4 mg/dL (ref 0.0–40.0)

## 2021-04-22 LAB — CBC
HCT: 43.3 % (ref 39.0–52.0)
Hemoglobin: 14.3 g/dL (ref 13.0–17.0)
MCHC: 33.1 g/dL (ref 30.0–36.0)
MCV: 88.2 fl (ref 78.0–100.0)
Platelets: 263 10*3/uL (ref 150.0–400.0)
RBC: 4.91 Mil/uL (ref 4.22–5.81)
RDW: 13.4 % (ref 11.5–15.5)
WBC: 6.3 10*3/uL (ref 4.0–10.5)

## 2021-04-22 LAB — PSA: PSA: 0.38 ng/mL (ref 0.10–4.00)

## 2021-04-22 LAB — VITAMIN B12: Vitamin B-12: 446 pg/mL (ref 211–911)

## 2021-04-22 LAB — VITAMIN D 25 HYDROXY (VIT D DEFICIENCY, FRACTURES): VITD: 38.24 ng/mL (ref 30.00–100.00)

## 2021-04-22 LAB — TSH: TSH: 0.86 u[IU]/mL (ref 0.35–5.50)

## 2021-04-22 LAB — TESTOSTERONE: Testosterone: 208.92 ng/dL — ABNORMAL LOW (ref 300.00–890.00)

## 2021-04-22 MED ORDER — TADALAFIL 5 MG PO TABS: 5.0000 mg | ORAL_TABLET | ORAL | 5 refills | Status: AC | PRN

## 2021-04-22 NOTE — Patient Instructions (Signed)
It was very nice to see you today!  We will check blood work.  Please start the Cialis.  I will see you back in year for your next physical.  Come back sooner if needed.  Take care, Dr Jimmey Ralph  PLEASE NOTE:  If you had any lab tests please let us know if you have not heard back within a few days. You may see your results on mychart before we have a chance to review them but we will give you a call once they are reviewed by [name]. If we ordered any referrals today, please let us know if you have not heard from their office within the next week.   Please try these tips to maintain a healthy lifestyle:  Eat at least 3 REAL meals and 1-2 snacks per day.  Aim for no more than 5 hours between eating.  If you eat breakfast, please do so within one hour of getting up.   Each meal should contain half fruits/vegetables, one quarter protein, and one quarter carbs (no bigger than a computer mouse)  Cut down on sweet beverages. This includes juice, soda, and sweet tea.   Drink at least 1 glass of water with each meal and aim for at least 8 glasses per day  Exercise at least 150 minutes every week.    Preventive Care 17-6 Years Old, Male Preventive care refers to lifestyle choices and visits with your health care provider that can promote health and wellness. Preventive care visits are also called wellness exams. What can I expect for my preventive care visit? Counseling During your preventive care visit, your health care provider may ask about your: Medical history, including: Past medical problems. Family medical history. Current health, including: Emotional well-being. Home life and relationship well-being. Sexual activity. Lifestyle, including: Alcohol, nicotine or tobacco, and drug use. Access to firearms. Diet, exercise, and sleep habits. Safety issues such as seatbelt and bike helmet use. Sunscreen use. Work and work Astronomer. Physical exam Your health care provider will check  your: Height and weight. These may be used to calculate your BMI (body mass index). BMI is a measurement that tells if you are at a healthy weight. Waist circumference. This measures the distance around your waistline. This measurement also tells if you are at a healthy weight and may help predict your risk of certain diseases, such as type 2 diabetes and high blood pressure. Heart rate and blood pressure. Body temperature. Skin for abnormal spots. What immunizations do I need? Vaccines are usually given at various ages, according to a schedule. Your health care provider will recommend vaccines for you based on your age, medical history, and lifestyle or other factors, such as travel or where you work. What tests do I need? Screening Your health care provider may recommend screening tests for certain conditions. This may include: Lipid and cholesterol levels. Diabetes screening. This is done by checking your blood sugar (glucose) after you have not eaten for a while (fasting). Hepatitis B test. Hepatitis C test. HIV (human immunodeficiency virus) test. STI (sexually transmitted infection) testing, if you are at risk. Lung cancer screening. Prostate cancer screening. Colorectal cancer screening. Talk with your health care provider about your test results, treatment options, and if necessary, the need for more tests. Follow these instructions at home: Eating and drinking  Eat a diet that includes fresh fruits and vegetables, whole grains, lean protein, and low-fat dairy products. Take vitamin and mineral supplements as recommended by your health care provider. Do  not drink alcohol if your health care provider tells you not to drink. If you drink alcohol: Limit how much you have to 0-2 drinks a day. Know how much alcohol is in your drink. In the [place] one drink equals one 12 oz bottle of beer (355 mL), one 5 oz glass of wine (148 mL), or one 1 oz glass of hard liquor (44  mL). Lifestyle Brush your teeth every morning and night with fluoride toothpaste. Floss one time each day. Exercise for at least 30 minutes 5 or more days each week. Do not use any products that contain nicotine or tobacco. These products include cigarettes, chewing tobacco, and vaping devices, such as e-cigarettes. If you need help quitting, ask your health care provider. Do not use drugs. If you are sexually active, practice safe sex. Use a condom or other form of protection to prevent STIs. Take aspirin only as told by your health care provider. Make sure that you understand how much to take and what form to take. Work with your health care provider to find out whether it is safe and beneficial for you to take aspirin daily. Find healthy ways to manage stress, such as: Meditation, yoga, or listening to music. Journaling. Talking to a trusted person. Spending time with friends and family. Minimize exposure to UV radiation to reduce your risk of skin cancer. Safety Always wear your seat belt while driving or riding in a vehicle. Do not drive: If you have been drinking alcohol. Do not ride with someone who has been drinking. When you are tired or distracted. While texting. If you have been using any mind-altering substances or drugs. Wear a helmet and other protective equipment during sports activities. If you have firearms in your house, make sure you follow all gun safety procedures. What's next? Go to your health care provider once a year for an annual wellness visit. Ask your health care provider how often you should have your eyes and teeth checked. Stay up to date on all vaccines. This information is not intended to replace advice given to you by your health care provider. Make sure you discuss any questions you have with your health care provider. Document Revised: 12/01/2020 Document Reviewed: 12/01/2020 Elsevier Patient Education  2022 [employer].

## 2021-04-22 NOTE — Assessment & Plan Note (Signed)
Start Cialis 5 mg daily as needed.  Discussed potential side effects.  Could be stress related.  We will check labs today as well.

## 2021-04-22 NOTE — Assessment & Plan Note (Signed)
Check labs.  Multifactorial.  We discussed medication versus therapy referral. He would like to hold off for now.  He will check in with me in a few weeks to let me know how he is doing. Discussed reasons to return to care.

## 2021-04-22 NOTE — Progress Notes (Signed)
Chief Complaint:  Jeffrey Nicholson is a 55 y.o. male who presents today for his annual comprehensive physical exam.    Assessment/Plan:  Chronic Problems Addressed Today: Stress Check labs.  Multifactorial.  We discussed medication versus therapy referral. He would like to hold off for now.  He will check in with me in a few weeks to let me know how he is doing. Discussed reasons to return to care.   Restless legs Stable. Check labs.   Erectile dysfunction Start Cialis 5 mg daily as needed.  Discussed potential side effects.  Could be stress related.  We will check labs today as well.   Body mass index is 26.64 kg/m. / Overweight    Preventative Healthcare: Check Labs. UTD on vaccines.   Patient Counseling(The following topics were reviewed and/or handout was given):  -Nutrition: Stressed importance of moderation in sodium/caffeine intake, saturated fat and cholesterol, caloric balance, sufficient intake of fresh fruits, vegetables, and fiber.  -Stressed the importance of regular exercise.   -Substance Abuse: Discussed cessation/primary prevention of tobacco, alcohol, or other drug use; driving or other dangerous activities under the influence; availability of treatment for abuse.   -Injury prevention: Discussed safety belts, safety helmets, smoke detector, smoking near bedding or upholstery.   -Sexuality: Discussed sexually transmitted diseases, partner selection, use of condoms, avoidance of unintended pregnancy and contraceptive alternatives.   -Dental health: Discussed importance of regular tooth brushing, flossing, and dental visits.  -Health maintenance and immunizations reviewed. Please refer to Health maintenance section.  Return to care in 1 year for next preventative visit.     Subjective:  HPI:  He has no acute complaints today.   He has noticed that his stress and anxiety have significantly increased, and that this has been getting worse over the past several  months. Moreover, he states that this has been affecting his libido. Because of this, he would like to have his hormone levels checked. He denies depression, and denies sleeping difficulties.  Additionally, he has had increased difficulty in handling work stress which he was able to handle before. He does not currently express interest in a therapist.   Also, he mentioned that he has ben trying to get into dermatology, but he has been continuously canceled on.   Lifestyle Diet: Knows he can improve diet, but has to work it into his new job schedule Exercise: Trying to work exercise into his new job schedule  Depression screen [facility] 2/9 04/02/2020  Decreased Interest 0  Down, Depressed, Hopeless 0  PHQ - 2 Score 0  Altered sleeping 1  Tired, decreased energy 0  Change in appetite 0  Feeling bad or failure about yourself  0  Trouble concentrating 0  Moving slowly or fidgety/restless 0  Suicidal thoughts 0  PHQ-9 Score 1  Difficult doing work/chores Not difficult at all    Health Maintenance Due  Topic Date Due   Pneumococcal Vaccine 54-24 Years old (1 - PCV) Never done   HIV Screening  Never done   Hepatitis C Screening  Never done   Zoster Vaccines- Shingrix (1 of 2) Never done     ROS: Per HPI, otherwise a complete review of systems was negative.   PMH:  The following were reviewed and entered/updated in epic: Past Medical History:  Diagnosis Date   Arthritis    knee   Neuromuscular disorder (HCC)    RLS    Patient Active Problem List   Diagnosis Date Noted   Stress 04/22/2021  Erectile dysfunction 04/22/2021   Insomnia 04/02/2020   Osteoarthritis 04/02/2020   Restless legs 04/02/2020   Past Surgical History:  Procedure Laterality Date   WISDOM TOOTH EXTRACTION     age 7    Family History  Problem Relation Age of Onset   Alcohol abuse Mother    Arthritis Mother    Depression Mother    Drug abuse Mother    Heart disease Mother    High Cholesterol  Mother    High blood pressure Mother    Kidney disease Mother    Depression Sister    High blood pressure Sister    High Cholesterol Sister    Heart disease Sister    Alcohol abuse Brother    Arthritis Brother    Heart attack Brother    Heart disease Brother    High blood pressure Brother    High Cholesterol Brother    Colon cancer Neg Hx    Colon polyps Neg Hx    Esophageal cancer Neg Hx    Rectal cancer Neg Hx    Stomach cancer Neg Hx     Medications- reviewed and updated Current Outpatient Medications  Medication Sig Dispense Refill   Diclofenac Sodium (PENNSAID) 2 % SOLN Place 1 application onto the skin 2 (two) times daily. 112 g 2   tadalafil (CIALIS) 5 MG tablet Take 1 tablet (5 mg total) by mouth every other day as needed for erectile dysfunction. 10 tablet 5   No current facility-administered medications for this visit.    Allergies-reviewed and updated No Known Allergies  Social History   Socioeconomic History   Marital status: Married    Spouse name: Not on file   Number of children: Not on file   Years of education: Not on file   Highest education level: Not on file  Occupational History   Not on file  Tobacco Use   Smoking status: Former   Smokeless tobacco: Former    Types: Chew  Substance and Sexual Activity   Alcohol use: Yes    Comment: 1-2 a week    Drug use: Never   Sexual activity: Not on file  Other Topics Concern   Not on file  Social History Narrative   Not on file   Social Determinants of Health   Financial Resource Strain: Not on file  Food Insecurity: Not on file  Transportation Needs: Not on file  Physical Activity: Not on file  Stress: Not on file  Social Connections: Not on file        Objective:  Physical Exam: BP 118/75   Pulse (!) 56   Temp 98.3 F (36.8 C) (Temporal)   Ht 6' (1.829 m)   Wt 196 lb 6.4 oz (89.1 kg)   SpO2 98%   BMI 26.64 kg/m   Body mass index is 26.64 kg/m. Wt Readings from Last 3  Encounters:  04/22/21 196 lb 6.4 oz (89.1 kg)  04/02/20 191 lb 3.2 oz (86.7 kg)  03/11/18 197 lb (89.4 kg)   Gen: NAD, resting comfortably HEENT: TMs normal bilaterally. OP clear. No thyromegaly noted.  CV: RRR with no murmurs appreciated Pulm: NWOB, CTAB with no crackles, wheezes, or rhonchi GI: Normal bowel sounds present. Soft, Nontender, Nondistended. MSK: no edema, cyanosis, or clubbing noted Skin: warm, dry Neuro: CN2-12 grossly intact. Strength 5/5 in upper and lower extremities. Reflexes symmetric and intact bilaterally.  Psych: Normal affect and thought content     I,Jordan Kelly,acting as a scribe for  Jacquiline Doe, MD.,have documented all relevant documentation on the behalf of Jacquiline Doe, MD,as directed by  Jacquiline Doe, MD while in the presence of Jacquiline Doe, MD.  I, Jacquiline Doe, MD, have reviewed all documentation for this visit. The documentation on 04/22/21 for the exam, diagnosis, procedures, and orders are all accurate and complete.  Katina Degree. Jimmey Ralph, MD 04/22/2021 1:50 PM

## 2021-04-22 NOTE — Assessment & Plan Note (Signed)
Stable Check labs 

## 2021-04-25 NOTE — Progress Notes (Signed)
Please inform patient of the following:  His "bad" cholesterol is borderline elevated. He does not need to start medications but he should continue working on diet and exercise and we can recheck in a year.   His testosterone is low. Recommend referral to urology if he is interested in replacement.  Everything else is NORMAL and we can recheck in a year.  Katina Degree. Jimmey Ralph, MD 04/25/2021 8:44 AM

## 2021-04-26 ENCOUNTER — Telehealth: Payer: Self-pay

## 2021-04-26 NOTE — Telephone Encounter (Signed)
Patient has called stating he has seen his lab results in mychart.  Looks like Dr. Jimmey Ralph has responded to the labs.  Patient needs to be informed of this info.  Patient also has questions in regard to labs.

## 2021-04-26 NOTE — Telephone Encounter (Signed)
See result note.  

## 2021-04-27 NOTE — Telephone Encounter (Signed)
Patient called back to speak to [facility] about lab results.

## 2021-04-28 ENCOUNTER — Other Ambulatory Visit: Payer: Self-pay | Admitting: *Deleted

## 2021-04-28 DIAGNOSIS — E349 Endocrine disorder, unspecified: Secondary | ICD-10-CM

## 2021-04-28 NOTE — Telephone Encounter (Signed)
Spoke with patient referral placed to preferred Urology

## 2021-04-28 NOTE — Telephone Encounter (Signed)
Patient has called back in regard.    Jeffrey Nicholson, to get a CMA to call patient right back.

## 2021-09-29 ENCOUNTER — Telehealth: Payer: Self-pay | Admitting: Family Medicine

## 2021-09-29 NOTE — Telephone Encounter (Signed)
See note, patient has OV on 10/04/2021  ?

## 2021-09-29 NOTE — Telephone Encounter (Signed)
PT states specialty provider requested updated labs for testosterone deficiency.  ? ?Office Visit scheduled for Tuesday 04/18 to speak with Dr. Jimmey Ralph. ? ?Please advise. ?

## 2021-10-03 NOTE — Telephone Encounter (Signed)
Noted. We can check his requested labs tomorrow. ? ?Katina Degree. Jimmey Ralph, MD ?10/03/2021 9:33 AM  ? ?

## 2021-10-04 ENCOUNTER — Other Ambulatory Visit: Payer: Self-pay | Admitting: *Deleted

## 2021-10-04 ENCOUNTER — Encounter: Payer: Self-pay | Admitting: Family Medicine

## 2021-10-04 ENCOUNTER — Ambulatory Visit: Payer: 59 | Admitting: Family Medicine

## 2021-10-04 VITALS — BP 134/89 | HR 54 | Temp 98.2°F | Ht 72.0 in | Wt 194.0 lb

## 2021-10-04 DIAGNOSIS — Z23 Encounter for immunization: Secondary | ICD-10-CM

## 2021-10-04 DIAGNOSIS — R7989 Other specified abnormal findings of blood chemistry: Secondary | ICD-10-CM

## 2021-10-04 DIAGNOSIS — M654 Radial styloid tenosynovitis [de Quervain]: Secondary | ICD-10-CM

## 2021-10-04 DIAGNOSIS — M1990 Unspecified osteoarthritis, unspecified site: Secondary | ICD-10-CM

## 2021-10-04 LAB — TESTOSTERONE: Testosterone: 114.97 ng/dL — ABNORMAL LOW (ref 300.00–890.00)

## 2021-10-04 MED ORDER — METHYLPREDNISOLONE ACETATE 40 MG/ML IJ SUSP
40.0000 mg | Freq: Once | INTRAMUSCULAR | Status: AC
  Administered 2021-10-04: 40 mg via INTRAMUSCULAR

## 2021-10-04 MED ORDER — MELOXICAM 15 MG PO TABS: 15.0000 mg | ORAL_TABLET | Freq: Every day | ORAL | 0 refills | Status: DC

## 2021-10-04 NOTE — Addendum Note (Signed)
Addended by: Dyann Kief on: 10/04/2021 09:18 AM ? ? Modules accepted: Orders ? ?

## 2021-10-04 NOTE — Patient Instructions (Addendum)
It was very nice to see you today! ? ?You have tendinitis.  We did a cortisone shot today.  Please start the meloxicam and use the splint.  Work on the exercises.  Let me know if not improving. ? ?We will check a testosterone level today. ? ?Take care, ?Dr Jimmey Ralph ? ?PLEASE NOTE: ? ?If you had any lab tests please let us know if you have not heard back within a few days. You may see your results on mychart before we have a chance to review them but we will give you a call once they are reviewed by [name]. If we ordered any referrals today, please let us know if you have not heard from their office within the next week.  ? ?Please try these tips to maintain a healthy lifestyle: ? ?Eat at least 3 REAL meals and 1-2 snacks per day.  Aim for no more than 5 hours between eating.  If you eat breakfast, please do so within one hour of getting up.  ? ?Each meal should contain half fruits/vegetables, one quarter protein, and one quarter carbs (no bigger than a computer mouse) ? ?Cut down on sweet beverages. This includes juice, soda, and sweet tea.  ? ?Drink at least 1 glass of water with each meal and aim for at least 8 glasses per day ? ?Exercise at least 150 minutes every week.   ?

## 2021-10-04 NOTE — Assessment & Plan Note (Signed)
Check testosterone level today.  Persistently low will refer to endocrinology. ?

## 2021-10-04 NOTE — Progress Notes (Signed)
? ?  Jeffrey Nicholson is a 56 y.o. male who presents today for an office visit. ? ?Assessment/Plan:  ?New/Acute Problems: ?Left Wrist Pain ?Consistent with de Quervain's tenosynovitis.  We discussed treatment options including steroid injection.  He elected proceed with this.  Please see below procedure note.  He did well without complication.  He noticed immediate improvement in pain after procedure. We will also start 1 to 2-week course of meloxicam.  He will also start wearing a thumb spica splint.  We discussed home exercises and handout was given.  Discussed reasons to return to care. ? ?Chronic Problems Addressed Today: ?Low testosterone ?Check testosterone level today.  Persistently low will refer to endocrinology. ? ?Osteoarthritis ?Likely has some underlying osteoarthritis contributing to his thumb pain though we will be treating his de Quervain's as above.  We will start meloxicam. ? ? ?  ?Subjective:  ?HPI: ? ?Patient here with wrist pain pain. Located on bilateral hands. He notes pain seems to be worse in the left hand. Pain is worse at the base of thumb. He has noticed grip weakness in the left hand. He has noticed some pain when grasping objects. He tried ibuprofen with no relief. No other treatment tried. No other aggravating or precipitating factors. No swelling. ? ?He is also requesting blood work today. He notes he saw his urologist and had blood work twice. His blood showed low and normal range testosterone levels. He would like this to be checked today.  ? ?   ?  ?Objective:  ?Physical Exam: ?BP 134/89   Pulse (!) 54   Temp 98.2 ?F (36.8 ?C) (Temporal)   Ht 6' (1.829 m)   Wt 194 lb (88 kg)   SpO2 98%   BMI 26.31 kg/m?   ?Gen: No acute distress, resting comfortably ?CV: Regular rate and rhythm with no murmurs appreciated ?Pulm: Normal work of breathing, clear to auscultation bilaterally with no crackles, wheezes, or rhonchi ?MSK: Very slight joint hypertrophy noted in bilateral hands.   Pain elicited at [facility] joint of left first digit.  Positive Finkelstein. ?Neuro: Grossly normal, moves all extremities ?Psych: Normal affect and thought content ? ?Procedure Note: ?Verbal consent was obtained after discussing benefits and risk including infection, hypopigmentation, and atrophy.  The area of maximal tenderness was identified and marked.  Area was then cleansed with alcohol swab x3. A 25-gauge needle was then inserted into the first extensor compartment towards the area of maximal tenderness.  Needle was ensured to not be in the tendon. Aspiration was negative.  A mixture of 2 mL of 1% lidocaine without epinephrine and 6mL of 40 mg/cc of Depo-Medrol was then injected.  Needle was withdrawn.  Sterile bandage was placed.  Minimal blood loss.  He tolerated well. ? ?   ? ? ?I,Savera Zaman,acting as a Neurosurgeon for Jacquiline Doe, MD.,have documented all relevant documentation on the behalf of Jacquiline Doe, MD,as directed by  Jacquiline Doe, MD while in the presence of Jacquiline Doe, MD.  ? ?I, Jacquiline Doe, MD, have reviewed all documentation for this visit. The documentation on 10/04/21 for the exam, diagnosis, procedures, and orders are all accurate and complete. ? ?Katina Degree. Jimmey Ralph, MD ?10/04/2021 8:59 AM  ? ?

## 2021-10-04 NOTE — Assessment & Plan Note (Signed)
Likely has some underlying osteoarthritis contributing to his thumb pain though we will be treating his de Quervain's as above.  We will start meloxicam. ?

## 2021-10-06 NOTE — Progress Notes (Signed)
Please inform patient of the following: ? ?Testosterone level still low. He can follow back up with urology we can refer him to see endocrinology.

## 2021-10-11 ENCOUNTER — Other Ambulatory Visit (INDEPENDENT_AMBULATORY_CARE_PROVIDER_SITE_OTHER): Payer: 59

## 2021-10-11 DIAGNOSIS — R7989 Other specified abnormal findings of blood chemistry: Secondary | ICD-10-CM

## 2021-10-11 LAB — TESTOSTERONE: Testosterone: 303.7 ng/dL (ref 300.00–890.00)

## 2021-10-13 ENCOUNTER — Other Ambulatory Visit: Payer: Self-pay | Admitting: Family Medicine

## 2021-10-13 NOTE — Progress Notes (Signed)
Please inform patient of the following: ? ?His testosterone is on the very bottom range of normal. He can follow up with urology or we can refer him to endocrinology. ? ?Katina Degree. Jimmey Ralph, MD ?10/13/2021 8:10 AM  ?

## 2021-11-29 ENCOUNTER — Ambulatory Visit (INDEPENDENT_AMBULATORY_CARE_PROVIDER_SITE_OTHER): Payer: 59

## 2021-11-29 DIAGNOSIS — Z23 Encounter for immunization: Secondary | ICD-10-CM | POA: Diagnosis not present

## 2022-03-13 ENCOUNTER — Encounter: Payer: Self-pay | Admitting: *Deleted

## 2022-03-21 ENCOUNTER — Encounter: Payer: Self-pay | Admitting: Family Medicine

## 2022-03-21 ENCOUNTER — Ambulatory Visit: Payer: 59 | Admitting: Family Medicine

## 2022-03-21 VITALS — BP 128/82 | HR 65 | Temp 97.7°F | Ht 72.0 in | Wt 195.4 lb

## 2022-03-21 DIAGNOSIS — N529 Male erectile dysfunction, unspecified: Secondary | ICD-10-CM | POA: Diagnosis not present

## 2022-03-21 DIAGNOSIS — G4700 Insomnia, unspecified: Secondary | ICD-10-CM

## 2022-03-21 DIAGNOSIS — R7989 Other specified abnormal findings of blood chemistry: Secondary | ICD-10-CM | POA: Diagnosis not present

## 2022-03-21 DIAGNOSIS — M1990 Unspecified osteoarthritis, unspecified site: Secondary | ICD-10-CM

## 2022-03-21 MED ORDER — ZOLPIDEM TARTRATE 10 MG PO TABS: 10.0000 mg | ORAL_TABLET | Freq: Every evening | ORAL | 3 refills | Status: DC | PRN

## 2022-03-21 NOTE — Assessment & Plan Note (Signed)
On cialis per urology

## 2022-03-21 NOTE — Progress Notes (Signed)
   Adal Sereno is a 56 y.o. male who presents today for an office visit.  Assessment/Plan:  Chronic Problems Addressed Today: Insomnia Symptoms are overall stable.  He has done well with Ambien in the past.  We will refill.  Database was reviewed today without any red flags.  Osteoarthritis Overall stable.  Recently saw sports medicine.  I want her taking meloxicam.  Low testosterone On testosterone replacement per urology.  Erectile dysfunction On cialis per urology     Subjective:  HPI:  See A/p for status of chronic conditions.    Main concern today is insomnia.  This has been a longstanding issue.  He has been on Ambien in the past that he uses as needed during trips.  Usually tolerates well with minimal side effects.  He has not been traveling much recently due to the [place] pandemic but will be starting traveling again soon and request refill.       Objective:  Physical Exam: BP 128/82   Pulse 65   Temp 97.7 F (36.5 C) (Temporal)   Ht 6' (1.829 m)   Wt 195 lb 6.4 oz (88.6 kg)   SpO2 97%   BMI 26.50 kg/m   Gen: No acute distress, resting comfortably CV: Regular rate and rhythm with no murmurs appreciated Pulm: Normal work of breathing, clear to auscultation bilaterally with no crackles, wheezes, or rhonchi Neuro: Grossly normal, moves all extremities Psych: Normal affect and thought content      [place] M. Jerline Pain, MD 03/21/2022 1:30 PM

## 2022-03-21 NOTE — Assessment & Plan Note (Signed)
Overall stable.  Recently saw sports medicine.  I want her taking meloxicam.

## 2022-03-21 NOTE — Patient Instructions (Signed)
It was very nice to see you today!  We will refill your Ambien today.  Please come back soon for your annual physical.  Please come back sooner if needed.  Take care, Dr Jerline Pain  PLEASE NOTE:  If you had any lab tests please let us know if you have not heard back within a few days. You may see your results on mychart before we have a chance to review them but we will give you a call once they are reviewed by [name]. If we ordered any referrals today, please let us know if you have not heard from their office within the next week.   Please try these tips to maintain a healthy lifestyle:  Eat at least 3 REAL meals and 1-2 snacks per day.  Aim for no more than 5 hours between eating.  If you eat breakfast, please do so within one hour of getting up.   Each meal should contain half fruits/vegetables, one quarter protein, and one quarter carbs (no bigger than a computer mouse)  Cut down on sweet beverages. This includes juice, soda, and sweet tea.   Drink at least 1 glass of water with each meal and aim for at least 8 glasses per day  Exercise at least 150 minutes every week.

## 2022-03-21 NOTE — Assessment & Plan Note (Signed)
Symptoms are overall stable.  He has done well with Ambien in the past.  We will refill.  Database was reviewed today without any red flags.

## 2022-03-21 NOTE — Assessment & Plan Note (Signed)
On testosterone replacement per urology.

## 2022-05-08 ENCOUNTER — Ambulatory Visit (INDEPENDENT_AMBULATORY_CARE_PROVIDER_SITE_OTHER): Payer: 59 | Admitting: Family Medicine

## 2022-05-08 ENCOUNTER — Encounter: Payer: Self-pay | Admitting: Family Medicine

## 2022-05-08 VITALS — BP 118/80 | HR 60 | Temp 97.8°F | Ht 72.0 in | Wt 193.6 lb

## 2022-05-08 DIAGNOSIS — G4700 Insomnia, unspecified: Secondary | ICD-10-CM

## 2022-05-08 DIAGNOSIS — G2581 Restless legs syndrome: Secondary | ICD-10-CM

## 2022-05-08 DIAGNOSIS — N529 Male erectile dysfunction, unspecified: Secondary | ICD-10-CM

## 2022-05-08 DIAGNOSIS — Z125 Encounter for screening for malignant neoplasm of prostate: Secondary | ICD-10-CM

## 2022-05-08 DIAGNOSIS — Z0001 Encounter for general adult medical examination with abnormal findings: Secondary | ICD-10-CM

## 2022-05-08 DIAGNOSIS — Z13220 Encounter for screening for lipoid disorders: Secondary | ICD-10-CM

## 2022-05-08 DIAGNOSIS — R7989 Other specified abnormal findings of blood chemistry: Secondary | ICD-10-CM

## 2022-05-08 DIAGNOSIS — Z131 Encounter for screening for diabetes mellitus: Secondary | ICD-10-CM

## 2022-05-08 LAB — LIPID PANEL
Cholesterol: 188 mg/dL (ref 0–200)
HDL: 55.3 mg/dL (ref >39.00)
LDL Cholesterol: 116 mg/dL — ABNORMAL HIGH (ref 0–99)
NonHDL: 133.17
Total CHOL/HDL Ratio: 3
Triglycerides: 88 mg/dL (ref 0.0–149.0)
VLDL: 17.6 mg/dL (ref 0.0–40.0)

## 2022-05-08 LAB — COMPREHENSIVE METABOLIC PANEL
ALT: 19 U/L (ref 0–53)
AST: 17 U/L (ref 0–37)
Albumin: 4.4 g/dL (ref 3.5–5.2)
Alkaline Phosphatase: 50 U/L (ref 39–117)
BUN: 12 mg/dL (ref 6–23)
CO2: 26 mEq/L (ref 19–32)
Calcium: 9 mg/dL (ref 8.4–10.5)
Chloride: 104 mEq/L (ref 96–112)
Creatinine, Ser: 0.94 mg/dL (ref 0.40–1.50)
GFR: 91.85 mL/min (ref >60.00)
Glucose, Bld: 90 mg/dL (ref 70–99)
Potassium: 4 mEq/L (ref 3.5–5.1)
Sodium: 138 mEq/L (ref 135–145)
Total Bilirubin: 1.3 mg/dL — ABNORMAL HIGH (ref 0.2–1.2)
Total Protein: 6.7 g/dL (ref 6.0–8.3)

## 2022-05-08 LAB — CBC
HCT: 50.1 % (ref 39.0–52.0)
Hemoglobin: 17 g/dL (ref 13.0–17.0)
MCHC: 33.9 g/dL (ref 30.0–36.0)
MCV: 90.3 fl (ref 78.0–100.0)
Platelets: 254 10*3/uL (ref 150.0–400.0)
RBC: 5.55 Mil/uL (ref 4.22–5.81)
RDW: 14.3 % (ref 11.5–15.5)
WBC: 6.4 10*3/uL (ref 4.0–10.5)

## 2022-05-08 LAB — HEMOGLOBIN A1C: Hgb A1c MFr Bld: 5.4 % (ref 4.6–6.5)

## 2022-05-08 LAB — TSH: TSH: 1.53 u[IU]/mL (ref 0.35–5.50)

## 2022-05-08 LAB — PSA: PSA: 0.49 ng/mL (ref 0.10–4.00)

## 2022-05-08 LAB — TESTOSTERONE: Testosterone: 1165.82 ng/dL — ABNORMAL HIGH (ref 300.00–890.00)

## 2022-05-08 NOTE — Assessment & Plan Note (Signed)
Overall stable.  We will check labs today.  He does notice there are certain dietary triggers.  He will try to avoid these going forward.  Symptoms are overall manageable.

## 2022-05-08 NOTE — Assessment & Plan Note (Signed)
On testosterone placement with urology.  We will check testosterone level today per patient request.

## 2022-05-08 NOTE — Assessment & Plan Note (Signed)
Stable on Ambien 10 mg nightly as needed.  Does not need refill today.  Uses while traveling.

## 2022-05-08 NOTE — Patient Instructions (Signed)
It was very nice to see you today!  We will check blood work today.   Please keep up the good work with your diet and exercise.  We will see back in year for your next physical.  Come back sooner if needed.  Take care, Dr Jimmey Ralph  PLEASE NOTE:  If you had any lab tests please let us know if you have not heard back within a few days. You may see your results on mychart before we have a chance to review them but we will give you a call once they are reviewed by [name]. If we ordered any referrals today, please let us know if you have not heard from their office within the next week.   Please try these tips to maintain a healthy lifestyle:  Eat at least 3 REAL meals and 1-2 snacks per day.  Aim for no more than 5 hours between eating.  If you eat breakfast, please do so within one hour of getting up.   Each meal should contain half fruits/vegetables, one quarter protein, and one quarter carbs (no bigger than a computer mouse)  Cut down on sweet beverages. This includes juice, soda, and sweet tea.   Drink at least 1 glass of water with each meal and aim for at least 8 glasses per day  Exercise at least 150 minutes every week.    Preventive Care 54-24 Years Old, Male Preventive care refers to lifestyle choices and visits with your health care provider that can promote health and wellness. Preventive care visits are also called wellness exams. What can I expect for my preventive care visit? Counseling During your preventive care visit, your health care provider may ask about your: Medical history, including: Past medical problems. Family medical history. Current health, including: Emotional well-being. Home life and relationship well-being. Sexual activity. Lifestyle, including: Alcohol, nicotine or tobacco, and drug use. Access to firearms. Diet, exercise, and sleep habits. Safety issues such as seatbelt and bike helmet use. Sunscreen use. Work and work Astronomer. Physical  exam Your health care provider will check your: Height and weight. These may be used to calculate your BMI (body mass index). BMI is a measurement that tells if you are at a healthy weight. Waist circumference. This measures the distance around your waistline. This measurement also tells if you are at a healthy weight and may help predict your risk of certain diseases, such as type 2 diabetes and high blood pressure. Heart rate and blood pressure. Body temperature. Skin for abnormal spots. What immunizations do I need?  Vaccines are usually given at various ages, according to a schedule. Your health care provider will recommend vaccines for you based on your age, medical history, and lifestyle or other factors, such as travel or where you work. What tests do I need? Screening Your health care provider may recommend screening tests for certain conditions. This may include: Lipid and cholesterol levels. Diabetes screening. This is done by checking your blood sugar (glucose) after you have not eaten for a while (fasting). Hepatitis B test. Hepatitis C test. HIV (human immunodeficiency virus) test. STI (sexually transmitted infection) testing, if you are at risk. Lung cancer screening. Prostate cancer screening. Colorectal cancer screening. Talk with your health care provider about your test results, treatment options, and if necessary, the need for more tests. Follow these instructions at home: Eating and drinking  Eat a diet that includes fresh fruits and vegetables, whole grains, lean protein, and low-fat dairy products. Take vitamin and mineral  supplements as recommended by your health care provider. Do not drink alcohol if your health care provider tells you not to drink. If you drink alcohol: Limit how much you have to 0-2 drinks a day. Know how much alcohol is in your drink. In the [place] one drink equals one 12 oz bottle of beer (355 mL), one 5 oz glass of wine (148 mL), or one 1  oz glass of hard liquor (44 mL). Lifestyle Brush your teeth every morning and night with fluoride toothpaste. Floss one time each day. Exercise for at least 30 minutes 5 or more days each week. Do not use any products that contain nicotine or tobacco. These products include cigarettes, chewing tobacco, and vaping devices, such as e-cigarettes. If you need help quitting, ask your health care provider. Do not use drugs. If you are sexually active, practice safe sex. Use a condom or other form of protection to prevent STIs. Take aspirin only as told by your health care provider. Make sure that you understand how much to take and what form to take. Work with your health care provider to find out whether it is safe and beneficial for you to take aspirin daily. Find healthy ways to manage stress, such as: Meditation, yoga, or listening to music. Journaling. Talking to a trusted person. Spending time with friends and family. Minimize exposure to UV radiation to reduce your risk of skin cancer. Safety Always wear your seat belt while driving or riding in a vehicle. Do not drive: If you have been drinking alcohol. Do not ride with someone who has been drinking. When you are tired or distracted. While texting. If you have been using any mind-altering substances or drugs. Wear a helmet and other protective equipment during sports activities. If you have firearms in your house, make sure you follow all gun safety procedures. What's next? Go to your health care provider once a year for an annual wellness visit. Ask your health care provider how often you should have your eyes and teeth checked. Stay up to date on all vaccines. This information is not intended to replace advice given to you by your health care provider. Make sure you discuss any questions you have with your health care provider. Document Revised: 12/01/2020 Document Reviewed: 12/01/2020 Elsevier Patient Education  [employer].

## 2022-05-08 NOTE — Progress Notes (Signed)
Chief Complaint:  Jeffrey Nicholson is a 56 y.o. male who presents today for his annual comprehensive physical exam.    Assessment/Plan:  Chronic Problems Addressed Today: Low testosterone On testosterone placement with urology.  We will check testosterone level today per patient request.  Erectile dysfunction Stable on Cialis 5 mg every day as needed.  Restless legs Overall stable.  We will check labs today.  He does notice there are certain dietary triggers.  He will try to avoid these going forward.  Symptoms are overall manageable.  Insomnia Stable on Ambien 10 mg nightly as needed.  Does not need refill today.  Uses while traveling.  Preventative Healthcare: Check labs.  Up-to-date on cancer screening.  Patient Counseling(The following topics were reviewed and/or handout was given):  -Nutrition: Stressed importance of moderation in sodium/caffeine intake, saturated fat and cholesterol, caloric balance, sufficient intake of fresh fruits, vegetables, and fiber.  -Stressed the importance of regular exercise.   -Substance Abuse: Discussed cessation/primary prevention of tobacco, alcohol, or other drug use; driving or other dangerous activities under the influence; availability of treatment for abuse.   -Injury prevention: Discussed safety belts, safety helmets, smoke detector, smoking near bedding or upholstery.   -Sexuality: Discussed sexually transmitted diseases, partner selection, use of condoms, avoidance of unintended pregnancy and contraceptive alternatives.   -Dental health: Discussed importance of regular tooth brushing, flossing, and dental visits.  -Health maintenance and immunizations reviewed. Please refer to Health maintenance section.  Return to care in 1 year for next preventative visit.     Subjective:  HPI:  He has no acute complaints today.   Lifestyle Diet: Balanced. Trying to get plenty of fruits and vegetables.  Exercise: Working on cardio  regularly.      05/08/2022    7:42 AM  Depression screen PHQ 2/9  Decreased Interest 0  Down, Depressed, Hopeless 0  PHQ - 2 Score 0    There are no preventive care reminders to display for this patient.   ROS: Per HPI, otherwise a complete review of systems was negative.   PMH:  The following were reviewed and entered/updated in epic: Past Medical History:  Diagnosis Date   Arthritis    knee   Neuromuscular disorder (HCC)    RLS    Patient Active Problem List   Diagnosis Date Noted   Low testosterone 10/04/2021   Stress 04/22/2021   Erectile dysfunction 04/22/2021   Insomnia 04/02/2020   Osteoarthritis 04/02/2020   Restless legs 04/02/2020   Past Surgical History:  Procedure Laterality Date   WISDOM TOOTH EXTRACTION     age 58    Family History  Problem Relation Age of Onset   Alcohol abuse Mother    Arthritis Mother    Depression Mother    Drug abuse Mother    Heart disease Mother    High Cholesterol Mother    High blood pressure Mother    Kidney disease Mother    Depression Sister    High blood pressure Sister    High Cholesterol Sister    Heart disease Sister    Alcohol abuse Brother    Arthritis Brother    Heart attack Brother    Heart disease Brother    High blood pressure Brother    High Cholesterol Brother    Colon cancer Neg Hx    Colon polyps Neg Hx    Esophageal cancer Neg Hx    Rectal cancer Neg Hx    Stomach cancer Neg Hx  Medications- reviewed and updated Current Outpatient Medications  Medication Sig Dispense Refill   Diclofenac Sodium (PENNSAID) 2 % SOLN Place 1 application onto the skin 2 (two) times daily. 112 g 2   tadalafil (CIALIS) 5 MG tablet Take 1 tablet (5 mg total) by mouth every other day as needed for erectile dysfunction. 10 tablet 5   testosterone cypionate (DEPOTESTOSTERONE CYPIONATE) 200 MG/ML injection Inject 200 mg into the muscle every 14 (fourteen) days.     zolpidem (AMBIEN) 10 MG tablet Take 1 tablet (10  mg total) by mouth at bedtime as needed for sleep. 15 tablet 3   meloxicam (MOBIC) 15 MG tablet Take 1 tablet (15 mg total) by mouth daily. (Patient not taking: Reported on 05/08/2022) 30 tablet 0   No current facility-administered medications for this visit.    Allergies-reviewed and updated No Known Allergies  Social History   Socioeconomic History   Marital status: Married    Spouse name: Not on file   Number of children: Not on file   Years of education: Not on file   Highest education level: Not on file  Occupational History   Not on file  Tobacco Use   Smoking status: Former   Smokeless tobacco: Former    Types: Chew  Substance and Sexual Activity   Alcohol use: Yes    Comment: 1-2 a week    Drug use: Never   Sexual activity: Not on file  Other Topics Concern   Not on file  Social History Narrative   Not on file   Social Determinants of Health   Financial Resource Strain: Not on file  Food Insecurity: Not on file  Transportation Needs: Not on file  Physical Activity: Not on file  Stress: Not on file  Social Connections: Not on file        Objective:  Physical Exam: BP 118/80   Pulse 60   Temp 97.8 F (36.6 C) (Temporal)   Ht 6' (1.829 m)   Wt 193 lb 9.6 oz (87.8 kg)   SpO2 97%   BMI 26.26 kg/m   Body mass index is 26.26 kg/m. Wt Readings from Last 3 Encounters:  05/08/22 193 lb 9.6 oz (87.8 kg)  03/21/22 195 lb 6.4 oz (88.6 kg)  10/04/21 194 lb (88 kg)   Gen: NAD, resting comfortably HEENT: TMs normal bilaterally. OP clear. No thyromegaly noted.  CV: RRR with no murmurs appreciated Pulm: NWOB, CTAB with no crackles, wheezes, or rhonchi GI: Normal bowel sounds present. Soft, Nontender, Nondistended. MSK: no edema, cyanosis, or clubbing noted Skin: warm, dry Neuro: CN2-12 grossly intact. Strength 5/5 in upper and lower extremities. Reflexes symmetric and intact bilaterally.  Psych: Normal affect and thought content     Walter Grima M. Jimmey Ralph,  MD 05/08/2022 8:20 AM

## 2022-05-08 NOTE — Assessment & Plan Note (Signed)
Stable on Cialis 5 mg every day as needed.

## 2022-05-10 ENCOUNTER — Telehealth: Payer: Self-pay

## 2022-05-10 NOTE — Telephone Encounter (Signed)
FYI said a detailed msg could be left.  I left a detailed msg about pts labs:   Ardith Dark, MD  [facility] Please inform patient of the following:  Cholesterol is borderline elevated but stable compared to previous.  He should continue to work on diet and exercise and we can recheck this in a year.  His testosterone is in the upper range.  Recommend he discuss with urology about dosing next time he sees them.  Everything else is stable and we can recheck in a year.

## 2022-05-10 NOTE — Progress Notes (Signed)
Please inform patient of the following:  Cholesterol is borderline elevated but stable compared to previous.  He should continue to work on diet and exercise and we can recheck this in a year.  His testosterone is in the upper range.  Recommend he discuss with urology about dosing next time he sees them.  Everything else is stable and we can recheck in a year.

## 2022-06-13 ENCOUNTER — Telehealth (INDEPENDENT_AMBULATORY_CARE_PROVIDER_SITE_OTHER): Payer: 59 | Admitting: Physician Assistant

## 2022-06-13 VITALS — Wt 195.0 lb

## 2022-06-13 DIAGNOSIS — U071 COVID-19: Secondary | ICD-10-CM

## 2022-06-13 MED ORDER — NIRMATRELVIR/RITONAVIR (PAXLOVID)TABLET: 3.0000 | ORAL_TABLET | Freq: Two times a day (BID) | ORAL | 0 refills | Status: AC

## 2022-06-13 NOTE — Progress Notes (Signed)
   Virtual Visit via Video Note  I connected with  Jeffrey Nicholson  on 06/13/22 at 11:30 AM EST by a video enabled telemedicine application and verified that I am speaking with the correct person using two identifiers.  Location: Patient: home Provider: Nature conservation officer at [employer] Persons present: Patient and myself   I discussed the limitations of evaluation and management by telemedicine and the availability of in person appointments. The patient expressed understanding and agreed to proceed.   History of Present Illness:  Chief complaint: COVID-19 positive via home test yesterday  Symptom onset: 06/12/22 Pertinent positives: Fever, nasal congestion, body aches, fatigue, some chest tightness, headache  Pertinent negatives: Chest pain, SOB, dizziness  Treatments tried: Tylenol, ibuprofen  Vaccine status: Fully vaccinated  Sick exposure: 06/09/22    Observations/Objective:   Gen: Awake, alert, no acute distress, very congested, resting in bed Resp: Breathing is even and non-labored Psych: calm/pleasant demeanor Neuro: Alert and Oriented x 3, + facial symmetry, speech is clear.   Assessment and Plan: COVID -19  Diagnosis confirmed via home antigen test.  First time with COVID illness. Within 5 day window of symptom onset, consider antiviral treatment. Risks versus benefits discussed.  Will start on Paxlovid. Pt aware of risks vs benefits and possible adverse reactions.   Advised self-isolation at home for the next 5 days and then masking around others for at least an additional 5 days.  Treat supportively at this time including sleeping prone, deep breathing exercises, pushing fluids, walking every few hours, vitamins C and D, and Tylenol or ibuprofen as needed.  The patient understands that COVID-19 illness can wax and wane.  Should the symptoms acutely worsen or patient starts to experience sudden shortness of breath, chest pain, severe weakness, the patient  will go straight to the emergency department.  Also advised home pulse oximetry monitoring and for any reading consistently under 92%, should also report to the emergency department.  The patient will continue to keep [name] updated.   Follow Up Instructions:    I discussed the assessment and treatment plan with the patient. The patient was provided an opportunity to ask questions and all were answered. The patient agreed with the plan and demonstrated an understanding of the instructions.   The patient was advised to call back or seek an in-person evaluation if the symptoms worsen or if the condition fails to improve as anticipated.  Jeffrey Nicholson M Jeffrey Butchko, PA-C

## 2022-09-27 ENCOUNTER — Other Ambulatory Visit: Payer: Self-pay | Admitting: Family Medicine

## 2023-01-31 ENCOUNTER — Other Ambulatory Visit: Payer: Self-pay | Admitting: Sports Medicine

## 2023-01-31 DIAGNOSIS — M19011 Primary osteoarthritis, right shoulder: Secondary | ICD-10-CM

## 2023-01-31 DIAGNOSIS — M12811 Other specific arthropathies, not elsewhere classified, right shoulder: Secondary | ICD-10-CM

## 2023-01-31 DIAGNOSIS — M25511 Pain in right shoulder: Secondary | ICD-10-CM

## 2023-02-07 ENCOUNTER — Ambulatory Visit
Admission: RE | Admit: 2023-02-07 | Discharge: 2023-02-07 | Disposition: A | Discharge status: Home / Self Care | Payer: 59 | Source: Ambulatory Visit | Attending: Sports Medicine | Admitting: Sports Medicine

## 2023-02-07 DIAGNOSIS — M25511 Pain in right shoulder: Secondary | ICD-10-CM

## 2023-02-07 DIAGNOSIS — M12811 Other specific arthropathies, not elsewhere classified, right shoulder: Secondary | ICD-10-CM

## 2023-03-26 ENCOUNTER — Other Ambulatory Visit: Payer: Self-pay | Admitting: Family Medicine

## 2023-04-21 ENCOUNTER — Other Ambulatory Visit: Payer: Self-pay | Admitting: Family Medicine

## 2023-04-27 NOTE — H&P (Signed)
PREOPERATIVE H&P  Chief Complaint: right shoulder pain  HPI: Jeffrey Nicholson is a 57 y.o. male here for evaluation of right shoulder pain.  It has been there for 2-3 years.  He has had injections, physical therapy and more recently had an MRI.  He gets pain with any kind of overhead activity.  He enjoys doing weightlifting and wants to do a tough mudder, but the shoulder has really been preventing him from being active.  He is otherwise fairly healthy.  He uses ibuprofen as needed.   This is significantly impairing activities of daily living. MRI demonstrates superior labral pathology as well as a full thickness cuff tear with some retraction.    He has elected for surgical management.   Past Medical History:  Diagnosis Date   Arthritis    knee   Neuromuscular disorder (HCC)    RLS    Past Surgical History:  Procedure Laterality Date   WISDOM TOOTH EXTRACTION     age 89   Social History   Socioeconomic History   Marital status: Married    Spouse name: Not on file   Number of children: Not on file   Years of education: Not on file   Highest education level: Not on file  Occupational History   Not on file  Tobacco Use   Smoking status: Former   Smokeless tobacco: Former    Types: Chew  Substance and Sexual Activity   Alcohol use: Yes    Comment: 1-2 a week    Drug use: Never   Sexual activity: Not on file  Other Topics Concern   Not on file  Social History Narrative   Not on file   Social Determinants of Health   Financial Resource Strain: Not on file  Food Insecurity: Not on file  Transportation Needs: Not on file  Physical Activity: Not on file  Stress: Not on file  Social Connections: Unknown (03/19/2023)   Received from [employer]   Social Network    Social Network: Not on file   Family History  Problem Relation Age of Onset   Alcohol abuse Mother    Arthritis Mother    Depression Mother    Drug abuse Mother    Heart disease Mother    High  Cholesterol Mother    High blood pressure Mother    Kidney disease Mother    Depression Sister    High blood pressure Sister    High Cholesterol Sister    Heart disease Sister    Alcohol abuse Brother    Arthritis Brother    Heart attack Brother    Heart disease Brother    High blood pressure Brother    High Cholesterol Brother    Colon cancer Neg Hx    Colon polyps Neg Hx    Esophageal cancer Neg Hx    Rectal cancer Neg Hx    Stomach cancer Neg Hx    No Known Allergies Prior to Admission medications   Medication Sig Start Date End Date Taking? Authorizing Provider  Diclofenac Sodium (PENNSAID) 2 % SOLN Place 1 application onto the skin 2 (two) times daily. 11/16/17   Andrena Mews, DO  tadalafil (CIALIS) 5 MG tablet Take 1 tablet (5 mg total) by mouth every other day as needed for erectile dysfunction. 04/22/21   Ardith Dark, MD  testosterone cypionate (DEPOTESTOSTERONE CYPIONATE) 200 MG/ML injection Inject 200 mg into the muscle every 14 (fourteen) days. 01/12/22   [provider]  zolpidem (AMBIEN) 10 MG tablet TAKE 1 TABLET BY MOUTH AT BEDTIME AS NEEDED FOR SLEEP 04/25/23   Ardith Dark, MD     Positive ROS: All other systems have been reviewed and were otherwise negative with the exception of those mentioned in the HPI and as above.  Physical Exam: General: Alert, no acute distress Cardiovascular: No pedal edema Respiratory: No cyanosis, no use of accessory musculature GI: No organomegaly, abdomen is soft and non-tender Skin: No lesions in the area of chief complaint Neurologic: Sensation intact distally Psychiatric: Patient is competent for consent with normal mood and affect Lymphatic: No axillary or cervical lymphadenopathy  MUSCULOSKELETAL: He has no significant tenderness over his AC joint.  He has full motion of the shoulder, but some midrange pain elicited with resisted supraspinatus testing.    MRI demonstrates superior labral pathology as well  as a full thickness cuff tear with some retraction.    Assessment: Right shoulder rotator cuff tear.     Plan: Plan for Procedure(s): SHOULDER ARTHROSCOPY WITH ROTATOR CUFF REPAIR AND SUBACROMIAL DECOMPRESSION SHOULDER ARTHROSCOPY WITH BICEPS TENOTOMY  The risks benefits and alternatives were discussed with the patient including but not limited to the risks of nonoperative treatment, versus surgical intervention including infection, bleeding, nerve injury,  blood clots, cardiopulmonary complications, morbidity, mortality, among others, and they were willing to proceed.    Armida Sans, PA-C    04/27/2023 1:24 PM

## 2023-05-10 ENCOUNTER — Encounter: Payer: 59 | Admitting: Family Medicine

## 2023-05-10 ENCOUNTER — Other Ambulatory Visit: Payer: Self-pay

## 2023-05-10 ENCOUNTER — Encounter (HOSPITAL_BASED_OUTPATIENT_CLINIC_OR_DEPARTMENT_OTHER): Payer: Self-pay | Admitting: Orthopedic Surgery

## 2023-05-15 NOTE — Progress Notes (Signed)
Enhanced Recovery after Surgery  Enhanced Recovery after Surgery is a protocol used to improve the stress on your body and your recovery after surgery.  Patient Instructions  The night before surgery:  No food after midnight. ONLY clear liquids after midnight  The day of surgery (if you do NOT have diabetes):  Drink ONE (1) Pre-Surgery Clear Ensure as directed.   This drink was given to you during your hospital  pre-op appointment visit. The pre-op nurse will instruct you on the time to drink the  Pre-Surgery Ensure depending on your surgery time. Finish the drink at the designated time by the pre-op nurse.  Nothing else to drink after completing the  Pre-Surgery Clear Ensure.  The day of surgery (if you have diabetes): Drink ONE (1) Gatorade 2 (G2) as directed. This drink was given to you during your hospital  pre-op appointment visit.  The pre-op nurse will instruct you on the time to drink the   Gatorade 2 (G2) depending on your surgery time. Color of the Gatorade may vary. Red is not allowed. Nothing else to drink after completing the  Gatorade 2 (G2).         If office.you have questions, please contact your surgeon's officeSurgical soap given with instructions, pt verbalized understanding. Benzoyl peroxide gel given with instructions, pt verbalized understanding.

## 2023-05-22 ENCOUNTER — Ambulatory Visit (HOSPITAL_BASED_OUTPATIENT_CLINIC_OR_DEPARTMENT_OTHER)
Admission: RE | Admit: 2023-05-22 | Discharge: 2023-05-22 | Disposition: A | Discharge status: Home / Self Care | Payer: 59 | Attending: Orthopedic Surgery | Admitting: Orthopedic Surgery

## 2023-05-22 ENCOUNTER — Ambulatory Visit (HOSPITAL_BASED_OUTPATIENT_CLINIC_OR_DEPARTMENT_OTHER): Payer: 59 | Admitting: Anesthesiology

## 2023-05-22 ENCOUNTER — Encounter (HOSPITAL_BASED_OUTPATIENT_CLINIC_OR_DEPARTMENT_OTHER): Payer: Self-pay | Admitting: Orthopedic Surgery

## 2023-05-22 ENCOUNTER — Other Ambulatory Visit: Payer: Self-pay

## 2023-05-22 ENCOUNTER — Encounter (HOSPITAL_BASED_OUTPATIENT_CLINIC_OR_DEPARTMENT_OTHER)
Admission: RE | Disposition: A | Discharge status: Home / Self Care | Payer: Self-pay | Source: Home / Self Care | Attending: Orthopedic Surgery

## 2023-05-22 DIAGNOSIS — M7541 Impingement syndrome of right shoulder: Secondary | ICD-10-CM | POA: Diagnosis present

## 2023-05-22 DIAGNOSIS — Z01818 Encounter for other preprocedural examination: Secondary | ICD-10-CM

## 2023-05-22 DIAGNOSIS — X58XXXA Exposure to other specified factors, initial encounter: Secondary | ICD-10-CM | POA: Diagnosis not present

## 2023-05-22 DIAGNOSIS — Z87891 Personal history of nicotine dependence: Secondary | ICD-10-CM | POA: Insufficient documentation

## 2023-05-22 DIAGNOSIS — M75101 Unspecified rotator cuff tear or rupture of right shoulder, not specified as traumatic: Secondary | ICD-10-CM | POA: Diagnosis not present

## 2023-05-22 DIAGNOSIS — M75111 Incomplete rotator cuff tear or rupture of right shoulder, not specified as traumatic: Secondary | ICD-10-CM | POA: Diagnosis not present

## 2023-05-22 DIAGNOSIS — S43431A Superior glenoid labrum lesion of right shoulder, initial encounter: Secondary | ICD-10-CM | POA: Diagnosis not present

## 2023-05-22 DIAGNOSIS — M7521 Bicipital tendinitis, right shoulder: Secondary | ICD-10-CM | POA: Diagnosis not present

## 2023-05-22 HISTORY — PX: SHOULDER ARTHROSCOPY WITH ROTATOR CUFF REPAIR AND SUBACROMIAL DECOMPRESSION: SHX5686

## 2023-05-22 HISTORY — DX: Unspecified rotator cuff tear or rupture of right shoulder, not specified as traumatic: M75.101

## 2023-05-22 SURGERY — SHOULDER ARTHROSCOPY WITH ROTATOR CUFF REPAIR AND SUBACROMIAL DECOMPRESSION
Anesthesia: General | Site: Shoulder | Laterality: Right

## 2023-05-22 MED ORDER — LIDOCAINE HCL (CARDIAC) PF 100 MG/5ML IV SOSY
PREFILLED_SYRINGE | INTRAVENOUS | Status: DC | PRN
  Administered 2023-05-22: 40 mg via INTRAVENOUS

## 2023-05-22 MED ORDER — FENTANYL CITRATE (PF) 100 MCG/2ML IJ SOLN: 25.0000 ug | INTRAMUSCULAR | Status: DC | PRN

## 2023-05-22 MED ORDER — BACLOFEN 10 MG PO TABS: 10.0000 mg | ORAL_TABLET | Freq: Three times a day (TID) | ORAL | 0 refills | Status: DC

## 2023-05-22 MED ORDER — SODIUM CHLORIDE 0.9% FLUSH: 3.0000 mL | Freq: Two times a day (BID) | INTRAVENOUS | Status: DC

## 2023-05-22 MED ORDER — SUGAMMADEX SODIUM 200 MG/2ML IV SOLN
INTRAVENOUS | Status: DC | PRN
  Administered 2023-05-22: 200 mg via INTRAVENOUS

## 2023-05-22 MED ORDER — PHENYLEPHRINE HCL-NACL 20-0.9 MG/250ML-% IV SOLN
INTRAVENOUS | Status: DC | PRN
  Administered 2023-05-22: 50 ug/min via INTRAVENOUS

## 2023-05-22 MED ORDER — FENTANYL CITRATE (PF) 100 MCG/2ML IJ SOLN
50.0000 ug | Freq: Once | INTRAMUSCULAR | Status: AC
  Administered 2023-05-22: 50 ug via INTRAVENOUS

## 2023-05-22 MED ORDER — FENTANYL CITRATE (PF) 100 MCG/2ML IJ SOLN
INTRAMUSCULAR | Status: DC | PRN
  Administered 2023-05-22 (×2): 50 ug via INTRAVENOUS

## 2023-05-22 MED ORDER — BUPIVACAINE LIPOSOME 1.3 % IJ SUSP
INTRAMUSCULAR | Status: DC | PRN
  Administered 2023-05-22: 10 mL via PERINEURAL

## 2023-05-22 MED ORDER — LACTATED RINGERS IV SOLN: INTRAVENOUS | Status: DC | PRN

## 2023-05-22 MED ORDER — CEFAZOLIN SODIUM-DEXTROSE 2-4 GM/100ML-% IV SOLN
INTRAVENOUS | Status: AC
  Filled 2023-05-22: qty 100

## 2023-05-22 MED ORDER — MIDAZOLAM HCL 2 MG/2ML IJ SOLN
2.0000 mg | Freq: Once | INTRAMUSCULAR | Status: AC
  Administered 2023-05-22: 2 mg via INTRAVENOUS

## 2023-05-22 MED ORDER — LACTATED RINGERS IV SOLN: INTRAVENOUS | Status: DC

## 2023-05-22 MED ORDER — ROCURONIUM BROMIDE 100 MG/10ML IV SOLN
INTRAVENOUS | Status: DC | PRN
  Administered 2023-05-22: 50 mg via INTRAVENOUS

## 2023-05-22 MED ORDER — MIDAZOLAM HCL 2 MG/2ML IJ SOLN
INTRAMUSCULAR | Status: AC
Start: 2023-05-22 — End: ?
  Filled 2023-05-22: qty 2

## 2023-05-22 MED ORDER — DEXAMETHASONE SODIUM PHOSPHATE 4 MG/ML IJ SOLN
INTRAMUSCULAR | Status: DC | PRN
  Administered 2023-05-22: 5 mg via INTRAVENOUS

## 2023-05-22 MED ORDER — BUPIVACAINE HCL (PF) 0.5 % IJ SOLN
INTRAMUSCULAR | Status: DC | PRN
  Administered 2023-05-22: 15 mL via PERINEURAL

## 2023-05-22 MED ORDER — FENTANYL CITRATE (PF) 100 MCG/2ML IJ SOLN
INTRAMUSCULAR | Status: AC
  Filled 2023-05-22: qty 2

## 2023-05-22 MED ORDER — ROCURONIUM BROMIDE 10 MG/ML (PF) SYRINGE
PREFILLED_SYRINGE | INTRAVENOUS | Status: AC
  Filled 2023-05-22: qty 10

## 2023-05-22 MED ORDER — ONDANSETRON HCL 4 MG/2ML IJ SOLN
INTRAMUSCULAR | Status: AC
  Filled 2023-05-22: qty 2

## 2023-05-22 MED ORDER — CEFAZOLIN SODIUM-DEXTROSE 2-4 GM/100ML-% IV SOLN
2.0000 g | INTRAVENOUS | Status: AC
  Administered 2023-05-22: 2 g via INTRAVENOUS

## 2023-05-22 MED ORDER — OXYCODONE HCL 5 MG PO TABS: 5.0000 mg | ORAL_TABLET | ORAL | 0 refills | Status: DC | PRN

## 2023-05-22 MED ORDER — AMISULPRIDE (ANTIEMETIC) 5 MG/2ML IV SOLN: 10.0000 mg | Freq: Once | INTRAVENOUS | Status: DC | PRN

## 2023-05-22 MED ORDER — ONDANSETRON HCL 4 MG/2ML IJ SOLN
INTRAMUSCULAR | Status: DC | PRN
  Administered 2023-05-22: 4 mg via INTRAVENOUS

## 2023-05-22 MED ORDER — ONDANSETRON HCL 4 MG PO TABS: 4.0000 mg | ORAL_TABLET | Freq: Three times a day (TID) | ORAL | 0 refills | Status: DC | PRN

## 2023-05-22 MED ORDER — ACETAMINOPHEN 500 MG PO TABS
1000.0000 mg | ORAL_TABLET | Freq: Once | ORAL | Status: AC
  Administered 2023-05-22: 1000 mg via ORAL

## 2023-05-22 MED ORDER — LIDOCAINE 2% (20 MG/ML) 5 ML SYRINGE
INTRAMUSCULAR | Status: AC
  Filled 2023-05-22: qty 5

## 2023-05-22 MED ORDER — ACETAMINOPHEN 500 MG PO TABS
ORAL_TABLET | ORAL | Status: AC
  Filled 2023-05-22: qty 2

## 2023-05-22 MED ORDER — PROPOFOL 10 MG/ML IV BOLUS
INTRAVENOUS | Status: AC
  Filled 2023-05-22: qty 20

## 2023-05-22 MED ORDER — SENNA-DOCUSATE SODIUM 8.6-50 MG PO TABS: 2.0000 | ORAL_TABLET | Freq: Every day | ORAL | 1 refills | Status: DC

## 2023-05-22 MED ORDER — PHENYLEPHRINE HCL (PRESSORS) 10 MG/ML IV SOLN
INTRAVENOUS | Status: DC | PRN
  Administered 2023-05-22: 160 ug via INTRAVENOUS

## 2023-05-22 MED ORDER — PROPOFOL 10 MG/ML IV BOLUS
INTRAVENOUS | Status: DC | PRN
  Administered 2023-05-22: 200 mg via INTRAVENOUS
  Administered 2023-05-22: 100 mg via INTRAVENOUS

## 2023-05-22 MED ORDER — DEXAMETHASONE SODIUM PHOSPHATE 10 MG/ML IJ SOLN
INTRAMUSCULAR | Status: AC
  Filled 2023-05-22: qty 1

## 2023-05-22 MED ORDER — SODIUM CHLORIDE 0.9 % IR SOLN
Status: DC | PRN
  Administered 2023-05-22: 12000 mL

## 2023-05-22 SURGICAL SUPPLY — 48 items
ANCHOR SUT BIO SW 4.75X19.1 (Anchor) IMPLANT
BLADE EXCALIBUR 4.0X13 (MISCELLANEOUS) IMPLANT
BLADE SURG 15 STRL LF DISP TIS (BLADE) IMPLANT
BURR OVAL 8 FLU 4.0X13 (MISCELLANEOUS) ×1 IMPLANT
CANNULA 5.75X71 LONG (CANNULA) ×1 IMPLANT
CANNULA TWIST IN 8.25X7CM (CANNULA) IMPLANT
CLSR STERI-STRIP ANTIMIC 1/2X4 (GAUZE/BANDAGES/DRESSINGS) ×1 IMPLANT
DISSECTOR 3.8MM X 13CM (MISCELLANEOUS) ×1 IMPLANT
DRAPE IMP U-DRAPE 54X76 (DRAPES) ×1 IMPLANT
DRAPE INCISE IOBAN 66X45 STRL (DRAPES) ×1 IMPLANT
DRAPE POUCH INSTRU U-SHP 10X18 (DRAPES) ×1 IMPLANT
DRAPE SHOULDER BEACH CHAIR (DRAPES) ×1 IMPLANT
DRAPE SURG 17X23 STRL (DRAPES) ×1 IMPLANT
DRAPE U-SHAPE 47X51 STRL (DRAPES) ×1 IMPLANT
DURAPREP 26ML APPLICATOR (WOUND CARE) ×1 IMPLANT
FIBERSTICK 2 (SUTURE) IMPLANT
GAUZE PAD ABD 8X10 STRL (GAUZE/BANDAGES/DRESSINGS) ×1 IMPLANT
GAUZE SPONGE 4X4 12PLY STRL (GAUZE/BANDAGES/DRESSINGS) ×1 IMPLANT
GLOVE BIO SURGEON STRL SZ7 (GLOVE) ×1 IMPLANT
GLOVE BIOGEL PI IND STRL 7.0 (GLOVE) ×1 IMPLANT
GLOVE BIOGEL PI IND STRL 8 (GLOVE) ×2 IMPLANT
GLOVE ORTHO TXT STRL SZ7.5 (GLOVE) ×1 IMPLANT
GOWN STRL REUS W/ TWL LRG LVL3 (GOWN DISPOSABLE) ×1 IMPLANT
GOWN STRL REUS W/ TWL XL LVL3 (GOWN DISPOSABLE) ×2 IMPLANT
LASSO 90 CVE QUICKPAS (DISPOSABLE) IMPLANT
MANIFOLD NEPTUNE II (INSTRUMENTS) ×1 IMPLANT
NDL HD SCORPION MEGA LOADER (NEEDLE) IMPLANT
PACK ARTHROSCOPY DSU (CUSTOM PROCEDURE TRAY) ×1 IMPLANT
PACK BASIN DAY SURGERY FS (CUSTOM PROCEDURE TRAY) ×1 IMPLANT
PAD COLD SHLDR WRAP-ON (PAD) IMPLANT
RESTRAINT HEAD UNIVERSAL NS (MISCELLANEOUS) ×1 IMPLANT
SHEET MEDIUM DRAPE 40X70 STRL (DRAPES) ×1 IMPLANT
SLEEVE SCD COMPRESS KNEE MED (STOCKING) ×1 IMPLANT
SLING ARM FOAM STRAP LRG (SOFTGOODS) IMPLANT
SPIKE FLUID TRANSFER (MISCELLANEOUS) IMPLANT
SUPPORT WRAP ARM LG (MISCELLANEOUS) ×1 IMPLANT
SUT FIBERWIRE #2 38 T-5 BLUE (SUTURE) ×1 IMPLANT
SUT MNCRL AB 4-0 PS2 18 (SUTURE) ×1 IMPLANT
SUT PDS AB 1 CT 36 (SUTURE) IMPLANT
SUT TIGER TAPE 7 IN WHITE (SUTURE) IMPLANT
SUT VIC AB 3-0 SH 27X BRD (SUTURE) IMPLANT
SUTURE FIBERWR #2 38 T-5 BLUE (SUTURE) IMPLANT
TAPE FIBER 2MM 7IN #2 BLUE (SUTURE) IMPLANT
TOWEL GREEN STERILE FF (TOWEL DISPOSABLE) ×1 IMPLANT
TUBE CONNECTING 20X1/4 (TUBING) IMPLANT
TUBING ARTHROSCOPY IRRIG 16FT (MISCELLANEOUS) ×1 IMPLANT
WAND ABLATOR APOLLO I90 (BUR) ×1 IMPLANT
WATER STERILE IRR 1000ML POUR (IV SOLUTION) ×1 IMPLANT

## 2023-05-22 NOTE — Op Note (Signed)
05/22/2023  12:31 PM  PATIENT:  Jeffrey Nicholson    PRE-OPERATIVE DIAGNOSIS: Right shoulder impingement syndrome, superior and anterior labral tearing, with supraspinatus tear  POST-OPERATIVE DIAGNOSIS:  Same  PROCEDURE: Right shoulder arthroscopy with extensive debridement of anterior and superior labrum with takedown of high-grade full-thickness supraspinatus tear and repair of supraspinatus with acromioplasty  SURGEON:  Eulas Post, MD  PHYSICIAN ASSISTANT: None  ANESTHESIA:   General  PREOPERATIVE INDICATIONS:  Jeffrey Nicholson is a  57 y.o. male with a diagnosis of right shoulder cartilage disorder, impingement, rotator cuff tear, biceps tendinits who failed conservative measures and elected for surgical management.    The risks benefits and alternatives were discussed with the patient preoperatively including but not limited to the risks of infection, bleeding, nerve injury, cardiopulmonary complications, the need for revision surgery, among others, and the patient was willing to proceed.  ESTIMATED BLOOD LOSS: 25 mL  OPERATIVE IMPLANTS:   Implant Name Type Inv. Item Serial No. Manufacturer Lot No. LRB No. Used Action  ANCHOR SUT BIO SW 4.75X19.1 - [id number] Anchor ANCHOR SUT BIO SW 4.75X19.1  [employer] [id number] Right 1 Implanted  ANCHOR SUT BIO SW 4.75X19.1 - [id number] Anchor ANCHOR SUT BIO SW 4.75X19.1  [employer] [id number] Right 1 Implanted  [employer] 4.75 SWIVELLOCK   [id number]  [id number] Right 1 Implanted    OPERATIVE FINDINGS: The shoulder had full motion during examination under anesthesia.  He had intact glenohumeral articular cartilage, the superior labrum had some fraying superiorly and anteriorly.  The subscapularis was intact.  There was fenestration of the articular capsule at the undersurface of the supraspinatus and the supraspinatus itself had a bilaminar full-thickness tear, with exposed footprint.  The tear tissue was relatively poor quality, with  significant fraying, but did not have too much retraction, and I was able to get an excellent repair.  The bone quality was excellent.  OPERATIVE PROCEDURE: The patient was brought to the operating room and placed in the supine position.  General anesthesia was administered.  He was placed in the beachchair position.  The right upper extremity was prepped and draped in usual sterile fashion.  Timeout performed.  The arm was examined and had full motion.  Diagnostic arthroscopy was carried out with the above named findings.  I used the arthroscopic shaver to debride the anterior and superior labrum.  I then went to the subacromial space, there was not too much in the way of bursal tissue, I did perform a takedown of the articular tissue which was extremely poor quality, basically already completely torn but just hanging on by a thread.  The tear was not too large, it really only had room at the articular margin with a single anchor.  This was placed from the superior portal, double loaded, and then I used a posterior lateral viewing portal to place the fiber tapes, and then used crossing suture technique to place into a posterolateral and an anterior lateral anchor.  Excellent reapposition of the tissue to the bone was achieved.  I then released the CA ligament and performed a light acromioplasty.  The instruments were removed, the portals closed with Monocryl followed by Steri-Strips and sterile gauze.  He was awakened and returned to the PACU in stable and satisfactory condition.  There were no complications and he tolerated the procedure well.   DIAGNOSES: Right shoulder, chronic rotator cuff tear, SLAP tear, and subacromial impingement.  POST-OPERATIVE DIAGNOSIS: same  PROCEDURE: Arthroscopic extensive debridement - 29823 Subdeltoid Bursa, Anterior Labrum,  and Superior Labrum Arthroscopic subacromial decompression - 16109 Arthroscopic rotator cuff repair - 60454   OPERATIVE FINDING: Exam  under anesthesia: Normal Articular space: Normal Chondral surfaces: Normal Biceps: Normal, although there was a small 10% posterior labral fraying, which I did debride as well Subscapularis: Intact  Supraspinatus: Incomplete tear the articular surface had fenestrations of the capsule, although the tendon itself had a bilaminar tear superiorly. Infraspinatus: Intact

## 2023-05-22 NOTE — Anesthesia Procedure Notes (Signed)
Procedure Name: Intubation Date/Time: 05/22/2023 10:41 AM  Performed by: Karen Kitchens, CRNAPre-anesthesia Checklist: Patient identified, Emergency Drugs available, Suction available and Patient being monitored Patient Re-evaluated:Patient Re-evaluated prior to induction Oxygen Delivery Method: Circle system utilized Preoxygenation: Pre-oxygenation with 100% oxygen Induction Type: IV induction Ventilation: Mask ventilation without difficulty Laryngoscope Size: Mac and 4 Grade View: Grade II Tube type: Oral Tube size: 7.0 mm Number of attempts: 1 Airway Equipment and Method: Stylet and Oral airway Placement Confirmation: ETT inserted through vocal cords under direct vision, positive ETCO2, breath sounds checked- equal and bilateral and CO2 detector Secured at: 23 cm Tube secured with: Tape Dental Injury: Teeth and Oropharynx as per pre-operative assessment

## 2023-05-22 NOTE — Anesthesia Postprocedure Evaluation (Signed)
Anesthesia Post Note  Patient: Jeffrey Nicholson  Procedure(s) Performed: SHOULDER ARTHROSCOPY WITH ROTATOR CUFF REPAIR , EXTENSIVE DEBRIDEMENT, AND ACROMIOPLASTY (Right: Shoulder)     Patient location during evaluation: PACU Anesthesia Type: General Level of consciousness: awake and alert Pain management: pain level controlled Vital Signs Assessment: post-procedure vital signs reviewed and stable Respiratory status: spontaneous breathing, nonlabored ventilation, respiratory function stable and patient connected to nasal cannula oxygen Cardiovascular status: blood pressure returned to baseline and stable Postop Assessment: no apparent nausea or vomiting Anesthetic complications: no  No notable events documented.  Last Vitals:  Vitals:   05/22/23 1315 05/22/23 1343  BP: 116/76 107/77  Pulse: 63 64  Resp: 14 16  Temp:  (!) 36.3 C  SpO2: 96% 98%    Last Pain:  Vitals:   05/22/23 1343  TempSrc:   PainSc: 0-No pain                 Kennieth Rad

## 2023-05-22 NOTE — Anesthesia Preprocedure Evaluation (Addendum)
Anesthesia Evaluation  Patient identified by MRN, date of birth, ID band Patient awake    Reviewed: Allergy & Precautions, NPO status , Patient's Chart, lab work & pertinent test results  Airway Mallampati: II  TM Distance: >3 FB Neck ROM: Full    Dental  (+) Dental Advisory Given   Pulmonary former smoker   breath sounds clear to auscultation       Cardiovascular negative cardio ROS  Rhythm:Regular Rate:Normal     Neuro/Psych negative neurological ROS     GI/Hepatic negative GI ROS, Neg liver ROS,,,  Endo/Other  negative endocrine ROS    Renal/GU negative Renal ROS     Musculoskeletal  (+) Arthritis ,    Abdominal   Peds  Hematology negative hematology ROS (+)   Anesthesia Other Findings   Reproductive/Obstetrics                             Anesthesia Physical Anesthesia Plan  ASA: 1  Anesthesia Plan: General   Post-op Pain Management: Regional block* and Tylenol PO (pre-op)*   Induction: Intravenous  PONV Risk Score and Plan: 2 and Dexamethasone, Ondansetron and Treatment may vary due to age or medical condition  Airway Management Planned: Oral ETT  Additional Equipment: None  Intra-op Plan:   Post-operative Plan: Extubation in OR  Informed Consent: I have reviewed the patients History and Physical, chart, labs and discussed the procedure including the risks, benefits and alternatives for the proposed anesthesia with the patient or authorized representative who has indicated his/her understanding and acceptance.     Dental advisory given  Plan Discussed with: CRNA  Anesthesia Plan Comments:        Anesthesia Quick Evaluation

## 2023-05-22 NOTE — Interval H&P Note (Signed)
History and Physical Interval Note:  05/22/2023 9:44 AM  Jeffrey Nicholson  has presented today for surgery, with the diagnosis of right shoulder cartilage disorder, impingement, rotator cuff tear, biceps tendinits.  The various methods of treatment have been discussed with the patient and family. After consideration of risks, benefits and other options for treatment, the patient has consented to  Procedure(s): SHOULDER ARTHROSCOPY WITH ROTATOR CUFF REPAIR AND SUBACROMIAL DECOMPRESSION (Right) SHOULDER ARTHROSCOPY WITH BICEPS TENOTOMY (Right) as a surgical intervention.  The patient's history has been reviewed, patient examined, no change in status, stable for surgery.  I have reviewed the patient's chart and labs.  Questions were answered to the patient's satisfaction.     Eulas Post

## 2023-05-22 NOTE — Transfer of Care (Signed)
Immediate Anesthesia Transfer of Care Note  Patient: Jeffrey Nicholson  Procedure(s) Performed: SHOULDER ARTHROSCOPY WITH ROTATOR CUFF REPAIR , EXTENSIVE DEBRIDEMENT, AND ACROMIOPLASTY (Right: Shoulder)  Patient Location: PACU  Anesthesia Type:General and Regional  Level of Consciousness: awake, alert , and oriented  Airway & Oxygen Therapy: Patient Spontanous Breathing and Patient connected to face mask oxygen  Post-op Assessment: Report given to RN and Post -op Vital signs reviewed and stable  Post vital signs: Reviewed and stable  Last Vitals:  Vitals Value Taken Time  BP 117/77 05/22/23 1300  Temp 36.7 C 05/22/23 1234  Pulse 64 05/22/23 1311  Resp 16 05/22/23 1311  SpO2 92 % 05/22/23 1311  Vitals shown include unfiled device data.  Last Pain:  Vitals:   05/22/23 1234  TempSrc:   PainSc: 0-No pain      Patients Stated Pain Goal: 4 (05/22/23 0802)  Complications: No notable events documented.

## 2023-05-22 NOTE — Progress Notes (Signed)
Assisted Dr. Casilda Carls with right, infraclavicular, ultrasound guided block. Side rails up, monitors on throughout procedure. See vital signs in flow sheet. Tolerated Procedure well.

## 2023-05-22 NOTE — Anesthesia Procedure Notes (Signed)
Anesthesia Regional Block: Interscalene brachial plexus block   Pre-Anesthetic Checklist: , timeout performed,  Correct Patient, Correct Site, Correct Laterality,  Correct Procedure, Correct Position, site marked,  Risks and benefits discussed,  Surgical consent,  Pre-op evaluation,  At surgeon's request and post-op pain management  Laterality: Right  Prep: chloraprep       Needles:  Injection technique: Single-shot  Needle Type: Echogenic Stimulator Needle     Needle Length: 9cm  Needle Gauge: 21     Additional Needles:   Procedures:, nerve stimulator,,, ultrasound used (permanent image in chart),,     Nerve Stimulator or Paresthesia:  Response: deltoid and biceps, 0.5 mA  Additional Responses:   Narrative:  Start time: 05/22/2023 8:45 AM End time: 05/22/2023 8:52 AM Injection made incrementally with aspirations every 5 mL.  Performed by: Personally  Anesthesiologist: Marcene Duos, MD

## 2023-05-22 NOTE — Discharge Instructions (Addendum)
No tylenol until 2:15 p.m.   Diet: As you were doing prior to hospitalization   Shower:  May shower but keep the wounds dry, use an occlusive plastic wrap, NO SOAKING IN TUB.  If the bandage gets wet, change with a clean dry gauze.  If you have a splint on, leave the splint in place and keep the splint dry with a plastic bag.  Dressing:  You may change your dressing 3-5 days after surgery, unless you have a splint.  If you have a splint, then just leave the splint in place and we will change your bandages during your first follow-up appointment.    If you had hand or foot surgery, we will plan to remove your stitches in about 2 weeks in the office.  For all other surgeries, there are sticky tapes (steri-strips) on your wounds and all the stitches are absorbable.  Leave the steri-strips in place when changing your dressings, they will peel off with time, usually 2-3 weeks.  Activity:  Increase activity slowly as tolerated, but follow the weight bearing instructions below.  The rules on driving is that you can not be taking narcotics while you drive, and you must feel in control of the vehicle.    Weight Bearing:   sling at all times except hygiene.    To prevent constipation: you may use a stool softener such as -  Colace (over the counter) 100 mg by mouth twice a day  Drink plenty of fluids (prune juice may be helpful) and high fiber foods Miralax (over the counter) for constipation as needed.    Itching:  If you experience itching with your medications, try taking only a single pain pill, or even half a pain pill at a time.  You may take up to 10 pain pills per day, and you can also use benadryl over the counter for itching or also to help with sleep.   Precautions:  If you experience chest pain or shortness of breath - call 911 immediately for transfer to the hospital emergency department!!  If you develop a fever greater that 101 F, purulent drainage from wound, increased redness or  drainage from wound, or calf pain -- Call the office at [phone]                                                Follow- Up Appointment:  Please call for an appointment to be seen in 2 weeks Alexis - [phone]  Post Anesthesia Home Care Instructions  Activity: Get plenty of rest for the remainder of the day. A responsible individual must stay with you for 24 hours following the procedure.  For the next 24 hours, DO NOT: -Drive a car -Advertising copywriter -Drink alcoholic beverages -Take any medication unless instructed by your physician -Make any legal decisions or sign important papers.  Meals: Start with liquid foods such as gelatin or soup. Progress to regular foods as tolerated. Avoid greasy, spicy, heavy foods. If nausea and/or vomiting occur, drink only clear liquids until the nausea and/or vomiting subsides. Call your physician if vomiting continues.  Special Instructions/Symptoms: Your throat may feel dry or sore from the anesthesia or the breathing tube placed in your throat during surgery. If this causes discomfort, gargle with warm salt water. The discomfort should disappear within 24 hours.  If you had a scopolamine patch  placed behind your ear for the management of post- operative nausea and/or vomiting:  1. The medication in the patch is effective for 72 hours, after which it should be removed.  Wrap patch in a tissue and discard in the trash. Wash hands thoroughly with soap and water. 2. You may remove the patch earlier than 72 hours if you experience unpleasant side effects which may include dry mouth, dizziness or visual disturbances. 3. Avoid touching the patch. Wash your hands with soap and water after contact with the patch.   Information for Discharge Teaching: EXPAREL (bupivacaine liposome injectable suspension)   Pain relief is important to your recovery. The goal is to control your pain so you can move easier and return to your normal activities as soon as  possible after your procedure. Your physician may use several types of medicines to manage pain, swelling, and more.  Your surgeon or anesthesiologist gave you EXPAREL(bupivacaine) to help control your pain after surgery.  EXPAREL is a local anesthetic designed to release slowly over an extended period of time to provide pain relief by numbing the tissue around the surgical site. EXPAREL is designed to release pain medication over time and can control pain for up to 72 hours. Depending on how you respond to EXPAREL, you may require less pain medication during your recovery. EXPAREL can help reduce or eliminate the need for opioids during the first few days after surgery when pain relief is needed the most. EXPAREL is not an opioid and is not addictive. It does not cause sleepiness or sedation.   Important! A teal colored band has been placed on your arm with the date, time and amount of EXPAREL you have received. Please leave this armband in place for the full 96 hours following administration, and then you may remove the band. If you return to the hospital for any reason within 96 hours following the administration of EXPAREL, the armband provides important information that your health care providers to know, and alerts them that you have received this anesthetic.    Possible side effects of EXPAREL: Temporary loss of sensation or ability to move in the area where medication was injected. Nausea, vomiting, constipation Rarely, numbness and tingling in your mouth or lips, lightheadedness, or anxiety may occur. Call your doctor right away if you think you may be experiencing any of these sensations, or if you have other questions regarding possible side effects.  Follow all other discharge instructions given to you by your surgeon or nurse. Eat a healthy diet and drink plenty of water or other fluids.

## 2023-05-23 ENCOUNTER — Encounter (HOSPITAL_BASED_OUTPATIENT_CLINIC_OR_DEPARTMENT_OTHER): Payer: Self-pay | Admitting: Orthopedic Surgery

## 2023-05-29 ENCOUNTER — Other Ambulatory Visit: Payer: Self-pay | Admitting: Family Medicine

## 2023-05-29 NOTE — Telephone Encounter (Signed)
Last OV: 06/13/2022  Next OV: 08/03/23  Last Filled: 04/25/23  Quantity: 15

## 2023-08-03 ENCOUNTER — Encounter: Payer: 59 | Admitting: Family Medicine

## 2023-11-06 ENCOUNTER — Encounter: Payer: Self-pay | Admitting: Family Medicine

## 2023-11-06 ENCOUNTER — Ambulatory Visit (INDEPENDENT_AMBULATORY_CARE_PROVIDER_SITE_OTHER): Payer: 59 | Admitting: Family Medicine

## 2023-11-06 VITALS — BP 126/84 | HR 58 | Temp 98.2°F | Wt 200.8 lb

## 2023-11-06 DIAGNOSIS — M1990 Unspecified osteoarthritis, unspecified site: Secondary | ICD-10-CM

## 2023-11-06 DIAGNOSIS — Z0001 Encounter for general adult medical examination with abnormal findings: Secondary | ICD-10-CM

## 2023-11-06 DIAGNOSIS — G4700 Insomnia, unspecified: Secondary | ICD-10-CM | POA: Diagnosis not present

## 2023-11-06 DIAGNOSIS — Z125 Encounter for screening for malignant neoplasm of prostate: Secondary | ICD-10-CM | POA: Diagnosis not present

## 2023-11-06 DIAGNOSIS — Z13220 Encounter for screening for lipoid disorders: Secondary | ICD-10-CM | POA: Diagnosis not present

## 2023-11-06 DIAGNOSIS — Z131 Encounter for screening for diabetes mellitus: Secondary | ICD-10-CM | POA: Diagnosis not present

## 2023-11-06 DIAGNOSIS — E7800 Pure hypercholesterolemia, unspecified: Secondary | ICD-10-CM

## 2023-11-06 DIAGNOSIS — R7989 Other specified abnormal findings of blood chemistry: Secondary | ICD-10-CM | POA: Diagnosis not present

## 2023-11-06 DIAGNOSIS — Z1159 Encounter for screening for other viral diseases: Secondary | ICD-10-CM

## 2023-11-06 DIAGNOSIS — Z114 Encounter for screening for human immunodeficiency virus [HIV]: Secondary | ICD-10-CM

## 2023-11-06 LAB — COMPREHENSIVE METABOLIC PANEL WITH GFR
ALT: 20 U/L (ref 0–53)
AST: 17 U/L (ref 0–37)
Albumin: 4.4 g/dL (ref 3.5–5.2)
Alkaline Phosphatase: 52 U/L (ref 39–117)
BUN: 11 mg/dL (ref 6–23)
CO2: 26 meq/L (ref 19–32)
Calcium: 9.2 mg/dL (ref 8.4–10.5)
Chloride: 106 meq/L (ref 96–112)
Creatinine, Ser: 0.85 mg/dL (ref 0.40–1.50)
GFR: 97.43 mL/min (ref >60.00)
Glucose, Bld: 96 mg/dL (ref 70–99)
Potassium: 4.2 meq/L (ref 3.5–5.1)
Sodium: 140 meq/L (ref 135–145)
Total Bilirubin: 1.4 mg/dL — ABNORMAL HIGH (ref 0.2–1.2)
Total Protein: 6.8 g/dL (ref 6.0–8.3)

## 2023-11-06 LAB — LIPID PANEL
Cholesterol: 238 mg/dL — ABNORMAL HIGH (ref 0–200)
HDL: 67.7 mg/dL (ref >39.00)
LDL Cholesterol: 150 mg/dL — ABNORMAL HIGH (ref 0–99)
NonHDL: 170.12
Total CHOL/HDL Ratio: 4
Triglycerides: 100 mg/dL (ref 0.0–149.0)
VLDL: 20 mg/dL (ref 0.0–40.0)

## 2023-11-06 LAB — CBC
HCT: 48.2 % (ref 39.0–52.0)
Hemoglobin: 16.3 g/dL (ref 13.0–17.0)
MCHC: 33.7 g/dL (ref 30.0–36.0)
MCV: 87.6 fl (ref 78.0–100.0)
Platelets: 263 10*3/uL (ref 150.0–400.0)
RBC: 5.51 Mil/uL (ref 4.22–5.81)
RDW: 14.4 % (ref 11.5–15.5)
WBC: 5.7 10*3/uL (ref 4.0–10.5)

## 2023-11-06 LAB — TSH: TSH: 1.68 u[IU]/mL (ref 0.35–5.50)

## 2023-11-06 LAB — PSA: PSA: 0.66 ng/mL (ref 0.10–4.00)

## 2023-11-06 LAB — HEMOGLOBIN A1C: Hgb A1c MFr Bld: 5.3 % (ref 4.6–6.5)

## 2023-11-06 LAB — TESTOSTERONE: Testosterone: >1600 ng/dL — ABNORMAL HIGH (ref 300.00–890.00)

## 2023-11-06 MED ORDER — ZOLPIDEM TARTRATE 10 MG PO TABS: 10.0000 mg | ORAL_TABLET | Freq: Every evening | ORAL | 0 refills | Status: DC | PRN

## 2023-11-06 NOTE — Progress Notes (Signed)
 Chief Complaint:  Jeffrey Nicholson is a 58 y.o. male who presents today for his annual comprehensive physical exam.    Assessment/Plan:  Chronic Problems Addressed Today: Insomnia Stable on Ambien  10 mg nightly as needed.  Only uses this when traveling.  Will refill today.  Tolerates well without side effects.  Low testosterone  On testosterone  replacement via urology.  Will check testosterone  level today per patient request.  He has noticed more "ups and downs."  He may benefit from either once weekly dosing or switching to transdermal replacement.  Advised him to discuss this with his urologist.  Osteoarthritis Continue management per orthopedics.  Doing well status post rotator Nicholson repair though is still working through rehab.  Preventative Healthcare: Check labs.  Up-to-date on colon cancer screening.  Up-to-date on vaccines.  Will order cardiac CT scan today.  Patient Counseling(The following topics were reviewed and/or handout was given):  -Nutrition: Stressed importance of moderation in sodium/caffeine intake, saturated fat and cholesterol, caloric balance, sufficient intake of fresh fruits, vegetables, and fiber.  -Stressed the importance of regular exercise.   -Substance Abuse: Discussed cessation/primary prevention of tobacco, alcohol, or other drug use; driving or other dangerous activities under the influence; availability of treatment for abuse.   -Injury prevention: Discussed safety belts, safety helmets, smoke detector, smoking near bedding or upholstery.   -Sexuality: Discussed sexually transmitted diseases, partner selection, use of condoms, avoidance of unintended pregnancy and contraceptive alternatives.   -Dental health: Discussed importance of regular tooth brushing, flossing, and dental visits.  -Health maintenance and immunizations reviewed. Please refer to Health maintenance section.  Return to care in 1 year for next preventative visit.     Subjective:   HPI:  He has no acute complaints today. He is here today for his annual physical.  Since our last visit he underwent right rotator Nicholson repair.  Still having mild pain to the area but this is manageable.  Lifestyle Diet: Balanced. Trying to get more fruits and vegetables.  Exercise: Trying to work out more.      11/06/2023    7:33 AM  Depression screen PHQ 2/9  Decreased Interest 0  Down, Depressed, Hopeless 0  PHQ - 2 Score 0    Health Maintenance Due  Topic Date Due   HIV Screening  Never done   Hepatitis C Screening  Never done     ROS: Per HPI, otherwise a complete review of systems was negative.   PMH:  The following were reviewed and entered/updated in epic: Past Medical History:  Diagnosis Date   Arthritis    knee   Neuromuscular disorder (HCC)    RLS    Right rotator Nicholson tear 05/22/2023   Patient Active Problem List   Diagnosis Date Noted   Low testosterone  10/04/2021   Stress 04/22/2021   Erectile dysfunction 04/22/2021   Insomnia 04/02/2020   Osteoarthritis 04/02/2020   Restless legs 04/02/2020   Past Surgical History:  Procedure Laterality Date   COLONOSCOPY     SHOULDER ARTHROSCOPY WITH ROTATOR Nicholson REPAIR AND SUBACROMIAL DECOMPRESSION Right 05/22/2023   Procedure: SHOULDER ARTHROSCOPY WITH ROTATOR Nicholson REPAIR , EXTENSIVE DEBRIDEMENT, AND ACROMIOPLASTY;  Surgeon: Osa Blase, MD;  Location: [facility];  Service: Orthopedics;  Laterality: Right;   WISDOM TOOTH EXTRACTION     age 34    Family History  Problem Relation Age of Onset   Alcohol abuse Mother    Arthritis Mother    Depression Mother    Drug abuse Mother  Heart disease Mother    High Cholesterol Mother    High blood pressure Mother    Kidney disease Mother    Depression Sister    High blood pressure Sister    High Cholesterol Sister    Heart disease Sister    Alcohol abuse Brother    Arthritis Brother    Heart attack Brother    Heart disease Brother     High blood pressure Brother    High Cholesterol Brother    Colon cancer Neg Hx    Colon polyps Neg Hx    Esophageal cancer Neg Hx    Rectal cancer Neg Hx    Stomach cancer Neg Hx     Medications- reviewed and updated Current Outpatient Medications  Medication Sig Dispense Refill   tadalafil  (CIALIS ) 5 MG tablet Take 1 tablet (5 mg total) by mouth every other day as needed for erectile dysfunction. 10 tablet 5   testosterone  cypionate (DEPOTESTOSTERONE CYPIONATE) 200 MG/ML injection Inject 200 mg into the muscle every 14 (fourteen) days.     zolpidem  (AMBIEN ) 10 MG tablet Take 1 tablet (10 mg total) by mouth at bedtime as needed. for sleep 15 tablet 0   No current facility-administered medications for this visit.    Allergies-reviewed and updated No Known Allergies  Social History   Socioeconomic History   Marital status: Married    Spouse name: Not on file   Number of children: Not on file   Years of education: Not on file   Highest education level: Bachelor's degree (e.g., BA, AB, BS)  Occupational History   Not on file  Tobacco Use   Smoking status: Former   Smokeless tobacco: Former    Types: Chew  Substance and Sexual Activity   Alcohol use: Yes    Comment: 1-2 a week    Drug use: Never   Sexual activity: Not on file  Other Topics Concern   Not on file  Social History Narrative   Not on file   Social Drivers of Health   Financial Resource Strain: Low Risk  (11/05/2023)   Overall Financial Resource Strain (CARDIA)    Difficulty of Paying Living Expenses: Not hard at all  Food Insecurity: No Food Insecurity (11/05/2023)   Hunger Vital Sign    Worried About Running Out of Food in the Last Year: Never true    Ran Out of Food in the Last Year: Never true  Transportation Needs: No Transportation Needs (11/05/2023)   PRAPARE - Administrator, Civil Service (Medical): No    Lack of Transportation (Non-Medical): No  Physical Activity: Insufficiently  Active (11/05/2023)   Exercise Vital Sign    Days of Exercise per Week: 2 days    Minutes of Exercise per Session: 60 min  Stress: Stress Concern Present (11/05/2023)   [employer] of Occupational Health - Occupational Stress Questionnaire    Feeling of Stress : Rather much  Social Connections: Moderately Integrated (11/05/2023)   Social Connection and Isolation Panel [NHANES]    Frequency of Communication with Friends and Family: Once a week    Frequency of Social Gatherings with Friends and Family: Once a week    Attends Religious Services: 1 to 4 times per year    Active Member of [employer] or Organizations: Yes    Attends Banker Meetings: 1 to 4 times per year    Marital Status: Married        Objective:  Physical Exam: BP 126/84  Pulse (!) 58   Temp 98.2 F (36.8 C) (Temporal)   Wt 200 lb 12.8 oz (91.1 kg)   SpO2 96%   BMI 27.23 kg/m   Body mass index is 27.23 kg/m. Wt Readings from Last 3 Encounters:  11/06/23 200 lb 12.8 oz (91.1 kg)  05/22/23 192 lb 0.3 oz (87.1 kg)  06/13/22 195 lb (88.5 kg)   Gen: NAD, resting comfortably HEENT: TMs normal bilaterally. OP clear. No thyromegaly noted.  CV: RRR with no murmurs appreciated Pulm: NWOB, CTAB with no crackles, wheezes, or rhonchi GI: Normal bowel sounds present. Soft, Nontender, Nondistended. MSK: no edema, cyanosis, or clubbing noted Skin: warm, dry Neuro: CN2-12 grossly intact. Strength 5/5 in upper and lower extremities. Reflexes symmetric and intact bilaterally.  Psych: Normal affect and thought content     Reem Fleury M. Daneil Dunker, MD 11/06/2023 8:16 AM

## 2023-11-06 NOTE — Patient Instructions (Signed)
 It was very nice to see you today!  We will check blood work today.  I will refill your Ambien .  We will order a cardiac CT scan.  This will look for calcifications in your heart.  They will call you to schedule this soon and we will call you with the results once we have the back.  Please keep up the great work with your diet and exercise.  I will see you back in year for your next physical.  Come back sooner if needed.  Return in about 1 year (around 11/05/2024) for Annual Physical.   Take care, Dr Daneil Dunker  PLEASE NOTE:  If you had any lab tests, please let us  know if you have not heard back within a few days. You may see your results on mychart before we have a chance to review them but we will give you a call once they are reviewed by us .   If we ordered any referrals today, please let us  know if you have not heard from their office within the next week.   If you had any urgent prescriptions sent in today, please check with the pharmacy within an hour of our visit to make sure the prescription was transmitted appropriately.   Please try these tips to maintain a healthy lifestyle:  Eat at least 3 REAL meals and 1-2 snacks per day.  Aim for no more than 5 hours between eating.  If you eat breakfast, please do so within one hour of getting up.   Each meal should contain half fruits/vegetables, one quarter protein, and one quarter carbs (no bigger than a computer mouse)  Cut down on sweet beverages. This includes juice, soda, and sweet tea.   Drink at least 1 glass of water with each meal and aim for at least 8 glasses per day  Exercise at least 150 minutes every week.     Preventive Care 56-18 Years Old, Male Preventive care refers to lifestyle choices and visits with your health care provider that can promote health and wellness. Preventive care visits are also called wellness exams. What can I expect for my preventive care visit? Counseling During your preventive care  visit, your health care provider may ask about your: Medical history, including: Past medical problems. Family medical history. Current health, including: Emotional well-being. Home life and relationship well-being. Sexual activity. Lifestyle, including: Alcohol, nicotine or tobacco, and drug use. Access to firearms. Diet, exercise, and sleep habits. Safety issues such as seatbelt and bike helmet use. Sunscreen use. Work and work Astronomer. Physical exam Your health care provider will check your: Height and weight. These may be used to calculate your BMI (body mass index). BMI is a measurement that tells if you are at a healthy weight. Waist circumference. This measures the distance around your waistline. This measurement also tells if you are at a healthy weight and may help predict your risk of certain diseases, such as type 2 diabetes and high blood pressure. Heart rate and blood pressure. Body temperature. Skin for abnormal spots. What immunizations do I need?  Vaccines are usually given at various ages, according to a schedule. Your health care provider will recommend vaccines for you based on your age, medical history, and lifestyle or other factors, such as travel or where you work. What tests do I need? Screening Your health care provider may recommend screening tests for certain conditions. This may include: Lipid and cholesterol levels. Diabetes screening. This is done by checking your blood sugar (  glucose) after you have not eaten for a while (fasting). Hepatitis B test. Hepatitis C test. HIV (human immunodeficiency virus) test. STI (sexually transmitted infection) testing, if you are at risk. Lung cancer screening. Prostate cancer screening. Colorectal cancer screening. Talk with your health care provider about your test results, treatment options, and if necessary, the need for more tests. Follow these instructions at home: Eating and drinking  Eat a diet that  includes fresh fruits and vegetables, whole grains, lean protein, and low-fat dairy products. Take vitamin and mineral supplements as recommended by your health care provider. Do not drink alcohol if your health care provider tells you not to drink. If you drink alcohol: Limit how much you have to 0-2 drinks a day. Know how much alcohol is in your drink. In the [place] one drink equals one 12 oz bottle of beer (355 mL), one 5 oz glass of wine (148 mL), or one 1 oz glass of hard liquor (44 mL). Lifestyle Brush your teeth every morning and night with fluoride toothpaste. Floss one time each day. Exercise for at least 30 minutes 5 or more days each week. Do not use any products that contain nicotine or tobacco. These products include cigarettes, chewing tobacco, and vaping devices, such as e-cigarettes. If you need help quitting, ask your health care provider. Do not use drugs. If you are sexually active, practice safe sex. Use a condom or other form of protection to prevent STIs. Take aspirin only as told by your health care provider. Make sure that you understand how much to take and what form to take. Work with your health care provider to find out whether it is safe and beneficial for you to take aspirin daily. Find healthy ways to manage stress, such as: Meditation, yoga, or listening to music. Journaling. Talking to a trusted person. Spending time with friends and family. Minimize exposure to UV radiation to reduce your risk of skin cancer. Safety Always wear your seat belt while driving or riding in a vehicle. Do not drive: If you have been drinking alcohol. Do not ride with someone who has been drinking. When you are tired or distracted. While texting. If you have been using any mind-altering substances or drugs. Wear a helmet and other protective equipment during sports activities. If you have firearms in your house, make sure you follow all gun safety procedures. What's next? Go to  your health care provider once a year for an annual wellness visit. Ask your health care provider how often you should have your eyes and teeth checked. Stay up to date on all vaccines. This information is not intended to replace advice given to you by your health care provider. Make sure you discuss any questions you have with your health care provider. Document Revised: 12/01/2020 Document Reviewed: 12/01/2020 Elsevier Patient Education  2024 [employer].

## 2023-11-06 NOTE — Assessment & Plan Note (Signed)
 Stable on Ambien  10 mg nightly as needed.  Only uses this when traveling.  Will refill today.  Tolerates well without side effects.

## 2023-11-06 NOTE — Assessment & Plan Note (Signed)
 Continue management per orthopedics.  Doing well status post rotator cuff repair though is still working through rehab.

## 2023-11-06 NOTE — Assessment & Plan Note (Signed)
 On testosterone  replacement via urology.  Will check testosterone  level today per patient request.  He has noticed more "ups and downs."  He may benefit from either once weekly dosing or switching to transdermal replacement.  Advised him to discuss this with his urologist.

## 2023-11-07 ENCOUNTER — Ambulatory Visit: Payer: Self-pay | Admitting: Family Medicine

## 2023-11-07 DIAGNOSIS — E785 Hyperlipidemia, unspecified: Secondary | ICD-10-CM | POA: Insufficient documentation

## 2023-11-07 LAB — HEPATITIS C ANTIBODY: Hepatitis C Ab: NONREACTIVE

## 2023-11-07 LAB — HIV ANTIBODY (ROUTINE TESTING W REFLEX): HIV 1&2 Ab, 4th Generation: NONREACTIVE

## 2023-11-07 NOTE — Progress Notes (Signed)
 As cholesterol is up compared to last year.  Do not need to start meds for this that he should continue to work on diet and exercise and we can recheck in year.  His testosterone  level is elevated however this is likely due to last checking too soon after his most recent injection.  Recommend that he come back here for a midcycle blood draw or he can do this with his urologist.  The rest of his labs are all at goal.  We can recheck in a year.

## 2023-11-08 NOTE — Progress Notes (Signed)
 Hepatitis and HIV test are both negative.

## 2023-11-30 ENCOUNTER — Ambulatory Visit (HOSPITAL_BASED_OUTPATIENT_CLINIC_OR_DEPARTMENT_OTHER)
Admission: RE | Admit: 2023-11-30 | Discharge: 2023-11-30 | Disposition: A | Discharge status: Home / Self Care | Payer: Self-pay | Source: Ambulatory Visit | Attending: Family Medicine | Admitting: Family Medicine

## 2023-11-30 DIAGNOSIS — Z0001 Encounter for general adult medical examination with abnormal findings: Secondary | ICD-10-CM | POA: Insufficient documentation

## 2023-11-30 DIAGNOSIS — E7800 Pure hypercholesterolemia, unspecified: Secondary | ICD-10-CM | POA: Insufficient documentation

## 2023-11-30 NOTE — Progress Notes (Signed)
 Great news! His cardiac calcium score is 0. This means he has no calcifications or blockages. Do not need to make any changes to treatmnet plan at this time. He should continue to work on diet and exercise and we can recheck in 5-10 years.

## 2024-01-01 ENCOUNTER — Other Ambulatory Visit: Payer: Self-pay | Admitting: Family Medicine

## 2024-02-13 ENCOUNTER — Other Ambulatory Visit: Payer: Self-pay | Admitting: Family Medicine

## 2024-05-21 ENCOUNTER — Ambulatory Visit

## 2024-05-21 ENCOUNTER — Ambulatory Visit: Admitting: Family Medicine

## 2024-05-21 VITALS — BP 138/98 | HR 77 | Temp 98.9°F | Ht 72.0 in | Wt 212.4 lb

## 2024-05-21 DIAGNOSIS — R0602 Shortness of breath: Secondary | ICD-10-CM

## 2024-05-21 DIAGNOSIS — R030 Elevated blood-pressure reading, without diagnosis of hypertension: Secondary | ICD-10-CM

## 2024-05-21 DIAGNOSIS — J208 Acute bronchitis due to other specified organisms: Secondary | ICD-10-CM | POA: Diagnosis not present

## 2024-05-21 DIAGNOSIS — R051 Acute cough: Secondary | ICD-10-CM | POA: Diagnosis not present

## 2024-05-21 DIAGNOSIS — B9689 Other specified bacterial agents as the cause of diseases classified elsewhere: Secondary | ICD-10-CM | POA: Diagnosis not present

## 2024-05-21 DIAGNOSIS — J029 Acute pharyngitis, unspecified: Secondary | ICD-10-CM | POA: Diagnosis not present

## 2024-05-21 LAB — POCT INFLUENZA A/B
Influenza A, POC: NEGATIVE
Influenza B, POC: NEGATIVE

## 2024-05-21 LAB — POCT RAPID STREP A (OFFICE): Rapid Strep A Screen: NEGATIVE

## 2024-05-21 LAB — POC COVID19 BINAXNOW: SARS Coronavirus 2 Ag: NEGATIVE

## 2024-05-21 MED ORDER — AZITHROMYCIN 250 MG PO TABS: ORAL_TABLET | ORAL | 0 refills | Status: AC

## 2024-05-21 MED ORDER — GUAIFENESIN-CODEINE 100-10 MG/5ML PO SOLN: 5.0000 mL | Freq: Four times a day (QID) | ORAL | 0 refills | Status: AC | PRN

## 2024-05-21 NOTE — Progress Notes (Signed)
 Subjective  CC:  Chief Complaint  Patient presents with   Flu Symptoms    Runny nose, cough, fever off and on for 10 days, sore throat, all started 10 days ago;    Same day acute visit; PCP not available. New pt to me. Chart reviewed.   HPI: Jeffrey Nicholson is a 58 y.o. male who presents to the office today to address the problems listed above in the chief complaint. Discussed the use of AI scribe software for clinical note transcription with the patient, who gave verbal consent to proceed.  History of Present Illness Jeffrey Nicholson is a 58 year old male who presents with persistent respiratory symptoms and shortness of breath.  Respiratory symptoms - Persistent cough for ten days, beginning the Saturday before Thanksgiving - Cough is lingering and occasionally productive of mucus - Shortness of breath, particularly pronounced last night - No history of lung disease, asthma, or wheezing - First episode of these respiratory symptoms - Cough has disrupted sleep  Constitutional symptoms - Sore throat at onset of illness - Headache and feverish sensations early in the course - Fatigue and body aches last week - Feels hot and clammy, but no documented fevers  Medication use and allergies - No specific medications taken for current symptoms - No allergies to codeine   PMH: non contributory  Assessment  1. Acute bacterial bronchitis   2. Acute cough   3. Sore throat   4. SOB (shortness of breath)      Plan  Assessment and Plan Assessment & Plan Acute bronchitis with persistent cough and shortness of breath Symptoms began approximately ten days ago with sore throat, headache, and fever, now mostly resolved. Persistent cough and shortness of breath with mild chest tightness. No asthma or wheezing. Differential includes bronchitis versus community-acquired pneumonia. COVID-19 test negative. Chest auscultation clear. Consideration of walking pneumonia due to persistent  symptoms. Unlikely pulmonary embolism given symptomatology. - Ordered chest x-ray to evaluate for pneumonia. - Prescribed antibiotic to cover potential bacterial infection. zpak - Prescribed Robitussin with codeine  for cough suppression and sleep aid. - Advised to report worsening symptoms, particularly increased shortness of breath.  Acute pharyngitis (resolved)- could point to covid, flu or viral etiology. But due to persistent sxs, will cover with antibiotics. Sore throat initially present, now mostly resolved. Mild residual throat symptoms persist.  Elevated BP: will monitor. Goes along with acute illness. No tachycardia   Follow up: prn Orders Placed This Encounter  Procedures   DG Chest 2 View   POCT rapid strep A   POCT Influenza A/B   POC COVID-19   Meds ordered this encounter  Medications   azithromycin  (ZITHROMAX ) 250 MG tablet    Sig: Take 2 tabs today, then 1 tab daily for 4 days    Dispense:  1 each    Refill:  0   guaiFENesin -codeine  100-10 MG/5ML syrup    Sig: Take 5 mLs by mouth every 6 (six) hours as needed for cough.    Dispense:  120 mL    Refill:  0     I reviewed the patients updated PMH, FH, and SocHx.  Patient Active Problem List   Diagnosis Date Noted   Dyslipidemia 11/07/2023   Low testosterone  10/04/2021   Stress 04/22/2021   Erectile dysfunction 04/22/2021   Insomnia 04/02/2020   Osteoarthritis 04/02/2020   Restless legs 04/02/2020   Current Meds  Medication Sig   azithromycin  (ZITHROMAX ) 250 MG tablet Take 2 tabs today, then 1 tab  daily for 4 days   guaiFENesin-codeine 100-10 MG/5ML syrup Take 5 mLs by mouth every 6 (six) hours as needed for cough.   tadalafil  (CIALIS ) 5 MG tablet Take 1 tablet (5 mg total) by mouth every other day as needed for erectile dysfunction.   testosterone  cypionate (DEPOTESTOSTERONE CYPIONATE) 200 MG/ML injection Inject 200 mg into the muscle every 14 (fourteen) days.   zolpidem  (AMBIEN ) 10 MG tablet TAKE 1 TABLET  BY MOUTH AT BEDTIME AS NEEDED FOR SLEEP.   Allergies: Patient has no known allergies. Family History: Patient family history includes Alcohol abuse in his brother and mother; Arthritis in his brother and mother; Depression in his mother and sister; Drug abuse in his mother; Heart attack in his brother; Heart disease in his brother, mother, and sister; High Cholesterol in his brother, mother, and sister; High blood pressure in his brother, mother, and sister; Kidney disease in his mother. Social History:  Patient  reports that he has quit smoking. He has quit using smokeless tobacco.  His smokeless tobacco use included chew. He reports current alcohol use. He reports that he does not use drugs.  Review of Systems: Constitutional: Negative for fever malaise or anorexia Cardiovascular: negative for chest pain Respiratory: negative for SOB or persistent cough Gastrointestinal: negative for abdominal pain  Objective  Vitals: BP (!) 138/98 (BP Location: Left Arm, Patient Position: Sitting, Cuff Size: Normal)   Pulse 77   Temp 98.9 F (37.2 C) (Temporal)   Ht 6' (1.829 m)   Wt 212 lb 6.4 oz (96.3 kg)   SpO2 95%   BMI 28.81 kg/m  General: no acute distress , A&Ox3, but clammy and warm HEENT: PEERL, conjunctiva normal, neck is supple, OP is clear, no LAD Cardiovascular:  RRR without murmur or gallop.  Respiratory:  Good breath sounds bilaterally, CTAB with normal respiratory effort, no wheezing or rales Skin:  Warm, no rashes  Office Visit on 05/21/2024  Component Date Value Ref Range Status   Rapid Strep A Screen 05/21/2024 Negative  Negative Final   Influenza A, POC 05/21/2024 Negative  Negative Final   Influenza B, POC 05/21/2024 Negative  Negative Final   SARS Coronavirus 2 Ag 05/21/2024 Negative  Negative Final    Commons side effects, risks, benefits, and alternatives for medications and treatment plan prescribed today were discussed, and the patient expressed understanding of the  given instructions. Patient is instructed to call or message via MyChart if he/she has any questions or concerns regarding our treatment plan. No barriers to understanding were identified. We discussed Red Flag symptoms and signs in detail. Patient expressed understanding regarding what to do in case of urgent or emergency type symptoms.  Medication list was reconciled, printed and provided to the patient in AVS. Patient instructions and summary information was reviewed with the patient as documented in the AVS. This note was prepared with assistance of Dragon voice recognition software. Occasional wrong-word or sound-a-like substitutions may have occurred due to the inherent limitations of voice recognition software

## 2024-05-21 NOTE — Patient Instructions (Signed)
 Please follow up if symptoms do not improve or as needed.     VISIT SUMMARY: You came in today because of a persistent cough and shortness of breath that started about ten days ago. Initially, you had a sore throat, headache, and felt feverish, but those symptoms have mostly resolved. You are now experiencing a lingering cough that sometimes produces mucus and has been disrupting your sleep. You also feel short of breath, especially at night.  YOUR PLAN: -ACUTE BRONCHITIS WITH PERSISTENT COUGH AND SHORTNESS OF BREATH: Acute bronchitis is an inflammation of the bronchial tubes in your lungs, often causing a persistent cough and shortness of breath. We have ordered a chest x-ray to check for pneumonia and prescribed an antibiotic to treat any potential bacterial infection. Additionally, you will take Robitussin with codeine to help suppress your cough and aid your sleep. Please report any worsening symptoms, especially if your shortness of breath increases.  -ACUTE PHARYNGITIS (RESOLVED): Acute pharyngitis is an inflammation of the throat that often causes a sore throat. Your sore throat has mostly resolved, but you may still have mild residual symptoms.  INSTRUCTIONS: Please get the chest x-ray as soon as possible. Take the prescribed antibiotic as directed and use Robitussin with codeine to help with your cough and sleep. If your symptoms worsen, particularly if you experience increased shortness of breath, contact us  immediately.                      Contains text generated by [employer].                                 Contains text generated by [employer].

## 2024-05-27 ENCOUNTER — Ambulatory Visit: Payer: Self-pay | Admitting: Family Medicine

## 2024-05-27 NOTE — Progress Notes (Signed)
 See mychart note Dear Mr. Heslin, Your chest xray is normal. I hope you are feeling better.  Sincerely, Dr. Jodie

## 2024-06-30 ENCOUNTER — Other Ambulatory Visit: Payer: Self-pay | Admitting: Family Medicine

## 2024-11-06 ENCOUNTER — Encounter: Admitting: Family Medicine
